# Patient Record
Sex: Female | Born: 2000 | Hispanic: Yes
Health system: Southern US, Academic
[De-identification: ages and names within clinical notes are randomized; demographics above are authoritative.]

## PROBLEM LIST (undated history)

## (undated) ENCOUNTER — Encounter

## (undated) ENCOUNTER — Ambulatory Visit
Payer: PRIVATE HEALTH INSURANCE | Attending: Student in an Organized Health Care Education/Training Program | Primary: Student in an Organized Health Care Education/Training Program

## (undated) ENCOUNTER — Ambulatory Visit

## (undated) ENCOUNTER — Ambulatory Visit: Attending: Pharmacist | Primary: Pharmacist

## (undated) ENCOUNTER — Telehealth
Attending: Student in an Organized Health Care Education/Training Program | Primary: Student in an Organized Health Care Education/Training Program

## (undated) ENCOUNTER — Encounter
Attending: Student in an Organized Health Care Education/Training Program | Primary: Student in an Organized Health Care Education/Training Program

## (undated) ENCOUNTER — Telehealth

## (undated) ENCOUNTER — Encounter: Attending: Family | Primary: Family

## (undated) ENCOUNTER — Encounter: Attending: Pharmacist | Primary: Pharmacist

## (undated) ENCOUNTER — Encounter: Attending: Internal Medicine | Primary: Internal Medicine

## (undated) ENCOUNTER — Ambulatory Visit
Attending: Student in an Organized Health Care Education/Training Program | Primary: Student in an Organized Health Care Education/Training Program

## (undated) ENCOUNTER — Inpatient Hospital Stay (HOSPITAL_COMMUNITY): Payer: Self-pay

## (undated) DIAGNOSIS — D649 Anemia, unspecified: Secondary | ICD-10-CM

## (undated) HISTORY — PX: LAPAROSCOPIC CHOLECYSTECTOMY: SUR755

## (undated) HISTORY — PX: TYMPANOSTOMY TUBE PLACEMENT: SHX32

## (undated) HISTORY — PX: ADENOIDECTOMY: SUR15

---
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
## 2020-12-31 ENCOUNTER — Other Ambulatory Visit: Payer: Self-pay

## 2020-12-31 ENCOUNTER — Emergency Department (HOSPITAL_COMMUNITY)
Admission: EM | Admit: 2020-12-31 | Discharge: 2020-12-31 | Disposition: A | Discharge status: Home / Self Care | Payer: Medicaid Other | Attending: Emergency Medicine | Admitting: Emergency Medicine

## 2020-12-31 DIAGNOSIS — M25521 Pain in right elbow: Secondary | ICD-10-CM | POA: Insufficient documentation

## 2020-12-31 DIAGNOSIS — Z20822 Contact with and (suspected) exposure to covid-19: Secondary | ICD-10-CM | POA: Diagnosis not present

## 2020-12-31 DIAGNOSIS — Z3A30 30 weeks gestation of pregnancy: Secondary | ICD-10-CM | POA: Diagnosis not present

## 2020-12-31 DIAGNOSIS — M25511 Pain in right shoulder: Secondary | ICD-10-CM | POA: Diagnosis not present

## 2020-12-31 DIAGNOSIS — M25572 Pain in left ankle and joints of left foot: Secondary | ICD-10-CM | POA: Diagnosis not present

## 2020-12-31 DIAGNOSIS — M25561 Pain in right knee: Secondary | ICD-10-CM | POA: Insufficient documentation

## 2020-12-31 DIAGNOSIS — M25571 Pain in right ankle and joints of right foot: Secondary | ICD-10-CM | POA: Diagnosis not present

## 2020-12-31 DIAGNOSIS — M25562 Pain in left knee: Secondary | ICD-10-CM | POA: Diagnosis not present

## 2020-12-31 DIAGNOSIS — M25512 Pain in left shoulder: Secondary | ICD-10-CM | POA: Diagnosis not present

## 2020-12-31 DIAGNOSIS — O26893 Other specified pregnancy related conditions, third trimester: Secondary | ICD-10-CM | POA: Insufficient documentation

## 2020-12-31 DIAGNOSIS — M2550 Pain in unspecified joint: Secondary | ICD-10-CM

## 2020-12-31 DIAGNOSIS — M79645 Pain in left finger(s): Secondary | ICD-10-CM | POA: Diagnosis not present

## 2020-12-31 DIAGNOSIS — M79644 Pain in right finger(s): Secondary | ICD-10-CM | POA: Insufficient documentation

## 2020-12-31 LAB — COMPREHENSIVE METABOLIC PANEL
ALT: 21 U/L (ref 0–44)
AST: 26 U/L (ref 15–41)
Albumin: 2.7 g/dL — ABNORMAL LOW (ref 3.5–5.0)
Alkaline Phosphatase: 140 U/L — ABNORMAL HIGH (ref 38–126)
Anion gap: 7 (ref 5–15)
BUN: 6 mg/dL (ref 6–20)
CO2: 23 mmol/L (ref 22–32)
Calcium: 8.9 mg/dL (ref 8.9–10.3)
Chloride: 104 mmol/L (ref 98–111)
Creatinine, Ser: 0.48 mg/dL (ref 0.44–1.00)
GFR, Estimated: >60 mL/min (ref >60)
Glucose, Bld: 89 mg/dL (ref 70–99)
Potassium: 3.9 mmol/L (ref 3.5–5.1)
Sodium: 134 mmol/L — ABNORMAL LOW (ref 135–145)
Total Bilirubin: 0.3 mg/dL (ref 0.3–1.2)
Total Protein: 6.6 g/dL (ref 6.5–8.1)

## 2020-12-31 LAB — URINALYSIS, ROUTINE W REFLEX MICROSCOPIC
Bilirubin Urine: NEGATIVE
Glucose, UA: NEGATIVE mg/dL
Hgb urine dipstick: NEGATIVE
Ketones, ur: NEGATIVE mg/dL
Nitrite: NEGATIVE
Protein, ur: NEGATIVE mg/dL
Specific Gravity, Urine: 1.012 (ref 1.005–1.030)
pH: 8 (ref 5.0–8.0)

## 2020-12-31 LAB — RESP PANEL BY RT-PCR (FLU A&B, COVID) ARPGX2
Influenza A by PCR: NEGATIVE
Influenza B by PCR: NEGATIVE
SARS Coronavirus 2 by RT PCR: NEGATIVE

## 2020-12-31 LAB — CBC WITH DIFFERENTIAL/PLATELET
Abs Immature Granulocytes: 0.02 10*3/uL (ref 0.00–0.07)
Basophils Absolute: 0 10*3/uL (ref 0.0–0.1)
Basophils Relative: 1 %
Eosinophils Absolute: 0.1 10*3/uL (ref 0.0–0.5)
Eosinophils Relative: 1 %
HCT: 32.6 % — ABNORMAL LOW (ref 36.0–46.0)
Hemoglobin: 10.6 g/dL — ABNORMAL LOW (ref 12.0–15.0)
Immature Granulocytes: 0 %
Lymphocytes Relative: 21 %
Lymphs Abs: 1.5 10*3/uL (ref 0.7–4.0)
MCH: 26.8 pg (ref 26.0–34.0)
MCHC: 32.5 g/dL (ref 30.0–36.0)
MCV: 82.5 fL (ref 80.0–100.0)
Monocytes Absolute: 0.3 10*3/uL (ref 0.1–1.0)
Monocytes Relative: 4 %
Neutro Abs: 5.4 10*3/uL (ref 1.7–7.7)
Neutrophils Relative %: 73 %
Platelets: 369 10*3/uL (ref 150–400)
RBC: 3.95 MIL/uL (ref 3.87–5.11)
RDW: 12.8 % (ref 11.5–15.5)
WBC: 7.3 10*3/uL (ref 4.0–10.5)
nRBC: 0 % (ref 0.0–0.2)

## 2020-12-31 LAB — C-REACTIVE PROTEIN: CRP: 2.3 mg/dL — ABNORMAL HIGH (ref <1.0)

## 2020-12-31 LAB — TSH: TSH: 1.864 u[IU]/mL (ref 0.350–4.500)

## 2020-12-31 LAB — SEDIMENTATION RATE: Sed Rate: 63 mm/hr — ABNORMAL HIGH (ref 0–22)

## 2020-12-31 MED ORDER — ACETAMINOPHEN 325 MG PO TABS: 650.0000 mg | ORAL_TABLET | Freq: Four times a day (QID) | ORAL | 0 refills | Status: AC | PRN

## 2020-12-31 NOTE — ED Triage Notes (Signed)
Patient complains of pain in "every joint that moves" that started a few days ago.

## 2020-12-31 NOTE — ED Provider Notes (Signed)
Emergency Medicine Provider Triage Evaluation Note  Theresa Todd , a 20 y.o. female  was evaluated in triage.  Pt complains of polyarthralgia.  Patient states symptoms have been mild for the past 1 to 2 days, however severe this morning.  She is unable to completely extend her left elbow, move her right wrist, or flex at the right hip.  She has never had similar symptoms before.  She took Tylenol this morning without improvement of symptoms.  No fevers, chills, nasal congestion, sore throat, cough.  She is [redacted] weeks pregnant, not considered high risk.  She takes prenatal vitamins, no other medications.  Review of Systems  Positive: Polyarthralgia  Negative: fever  Physical Exam  BP 120/86 (BP Location: Left Arm)   Pulse 93   Temp 98.9 F (37.2 C) (Oral)   Resp 16   SpO2 99%  Gen:   Awake, no distress   Resp:  Normal effort  MSK:   Limited ROM of multiple joints due to pain.  Tenderness palpation of the joints.  No erythema or warmth. Other:  No ttp of back or midline spine. No ttp of the abd  Medical Decision Making  Medically screening exam initiated at 11:39 AM.  Appropriate orders placed.  Theresa Todd was informed that the remainder of the evaluation will be completed by another provider, this initial triage assessment does not replace that evaluation, and the importance of remaining in the ED until their evaluation is complete.  Called [facility]U and discussed with Melanie, APP at the MAU.  Did not feel this was related to patient's pregnancy, and thus did not feel patient was appropriate for transfer to MAU.  As such, all labs and COVID test ordered.  Urine ordered   Alveria Apley, PA-C 12/31/20 1141    Cheryll Cockayne, MD 12/31/20 1443

## 2020-12-31 NOTE — ED Notes (Signed)
Pt sitting up in hallway stretcher, no distress noted. Calm and friendly. Describes recent joint pain and swelling. Notable swelling to knuckles of fingers on both hands. Pt reports ankle swelling and pain as well. Denies history of arthritis, autoimmune conditions etc. Denies recent injury to swollen sites. RN obtained covid swab. Labs and urine obtained in triage. Resting in stretcher, denies needs at this time.

## 2020-12-31 NOTE — ED Provider Notes (Signed)
   3:14 PM Patient signed out to me by previous ED physician.  Patient is a 20 yo female, currently [redacted] weeks pregnant, presenting to ED for polyarthralgia's. Denies fevers, chills, URI symptoms. Signed out to me pending laboratory studies. Currently patient is Aox3, nontoxic appearing, no acute distress, with stable vitals.   Polyarthralgia: -All serious life threatening etiologies considered including but not limited to disseminated gonococcal infection, rubella in pregnancy, viral arthritis, etc.  -No hx of neck pain/stiffness, fevers, rashes, or uri symptoms. No fever, stable vitals, no leukocytosis.  -ANA sent, results in 24-48 hrs -nonspecific inflammatory markers CRP and Sed rate elevated.    Patient in no distress and overall condition improved here in the ED. Detailed discussions were had with the patient regarding current findings, and need for close f/u with Rheumatologist first thing Monday morning. Multiple local Rheumatologists provided. Tylenol sent to pharmacy for symptomatic relief. The patient has been instructed to return immediately if the symptoms worsen in any way for re-evaluation. Patient verbalized understanding and is in agreement with current care plan. All questions answered prior to discharge.     Edwin Dada P, DO 12/31/20 1732

## 2020-12-31 NOTE — ED Provider Notes (Signed)
[facility] EMERGENCY DEPARTMENT Provider Note   CSN: [id number] Arrival date & time: 12/31/20  1126     History Chief Complaint  Patient presents with   Joint Pain    Theresa Todd is a 20 y.o. female.  HPI Patient is [redacted] weeks gestation with a uncomplicated pregnancy.  Patient reports that she has been well but 3 weeks ago noticed pain in her feet.  The toe joints particularly the large toes felt stiff.  It seemed to go away on its own and she just assumed it was due to a lot of walking at work.  2 weeks ago however the pain came back and then started involving multiple joints and in a sending fashion.  She reports now she has swelling on the left side of her left ankle.  The toe joints are quite painful when she walks.  She reports both knees are painful.  Also she has now gotten swelling in the sides of several fingers, more pronounced on the left hand and also the left elbow is so painful she cannot straighten it all the way out.  She reports she can still straighten the right elbow.  She reports both shoulders are also achy and elevating them much is painful.  He denies she has been sick or had anything like this before.  She denies she has had any fevers or chills.  She has not had any headache.  No rashes, no cough, no sore throat.  He has been trying Tylenol and IcyHot with very little relief.  He denies any history of similar episodes.  He reports she has some mosquito bites on her ankles but has not had tick bites and has not had headaches, fevers, chills.    No past medical history on file.  Patient Active Problem List   Diagnosis Date Noted   Migraine without aura and without status migrainosus, not intractable 08/07/2015   Neuropathic pain 06/24/2015   COUGH 03/07/2007   ANEMIA, OTHER, UNSPECIFIED 07/12/2006   HEARING LOSS NOS OR DEAFNESS 07/12/2006    Past Surgical History:  Procedure Laterality Date   ADENOIDECTOMY     COCHLEAR IMPLANT Right 11-02-14    TYMPANOSTOMY TUBE PLACEMENT       OB History   No obstetric history on file.     No family history on file.  Social History   Tobacco Use   Smoking status: Never   Smokeless tobacco: Never  Substance Use Topics   Alcohol use: No   Drug use: No    Home Medications Prior to Admission medications   Medication Sig Start Date End Date Taking? Authorizing Provider  gabapentin (NEURONTIN) 300 MG capsule 1 tab nightly and as needed for headache 06/24/15   Margurite Auerbach, MD  promethazine (PHENERGAN) 12.5 MG tablet 1 tablet every 6 hours as needed for headache. 06/24/15   Margurite Auerbach, MD    Allergies    Patient has no known allergies.  Review of Systems   Review of Systems 10 systems reviewed and negative except per HPI Physical Exam Updated Vital Signs BP 113/79   Pulse 95   Temp 98.9 F (37.2 C) (Oral)   Resp 16   SpO2 98%   Physical Exam Constitutional:      Appearance: Normal appearance.  HENT:     Mouth/Throat:     Mouth: Mucous membranes are moist.     Pharynx: Oropharynx is clear.  Eyes:     Extraocular Movements: Extraocular  movements intact.     Pupils: Pupils are equal, round, and reactive to light.  Cardiovascular:     Rate and Rhythm: Normal rate and regular rhythm.  Pulmonary:     Effort: Pulmonary effort is normal.     Breath sounds: Normal breath sounds.  Abdominal:     Palpations: Abdomen is soft.     Comments: Abdomen is gravid but nontender.  Musculoskeletal:     Cervical back: Neck supple.     Comments: Patient has a number of painful joints.  She does not have erythema of the joints.  They are not warm.  There is some notable swelling on the side of the left ankle and several fingers on the left hand.  Engine motion is limited around the shoulders and the elbow on the left due to pain.  The joints however are not hot, erythematous.  See attached images.  No peripheral edema.  Skin:    General: Skin is warm and dry.  Neurological:      General: No focal deficit present.     Mental Status: She is alert and oriented to person, place, and time.     Coordination: Coordination normal.  Psychiatric:        Mood and Affect: Mood normal.           ED Results / Procedures / Treatments   Labs (all labs ordered are listed, but only abnormal results are displayed) Labs Reviewed  CBC WITH DIFFERENTIAL/PLATELET - Abnormal; Notable for the following components:      Result Value   Hemoglobin 10.6 (*)    HCT 32.6 (*)    All other components within normal limits  COMPREHENSIVE METABOLIC PANEL - Abnormal; Notable for the following components:   Sodium 134 (*)    Albumin 2.7 (*)    Alkaline Phosphatase 140 (*)    All other components within normal limits  URINALYSIS, ROUTINE W REFLEX MICROSCOPIC - Abnormal; Notable for the following components:   Leukocytes,Ua SMALL (*)    Bacteria, UA RARE (*)    All other components within normal limits  C-REACTIVE PROTEIN - Abnormal; Notable for the following components:   CRP 2.3 (*)    All other components within normal limits  RESP PANEL BY RT-PCR (FLU A&B, COVID) ARPGX2  ANA W/REFLEX IF POSITIVE  SEDIMENTATION RATE  RHEUMATOID FACTOR  TSH    EKG None  Radiology No results found.  Procedures Procedures   Medications Ordered in ED Medications - No data to display  ED Course  I have reviewed the triage vital signs and the nursing notes.  Pertinent labs & imaging results that were available during my care of the patient were reviewed by me and considered in my medical decision making (see chart for details).    MDM Rules/Calculators/A&P                           Patient has developed a polyarthralgia that seem to start from the feet and has ascended.  Patient has not had any fever, no rash, no headache, no URI symptoms.  She is otherwise clinically well.  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  CRP elevated.  Awaiting sed rate.  ANA  and rheumatoid factor ordered.  Oncoming Dr. 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 Final Clinical Impression(s) / ED Diagnoses Final diagnoses:  Polyarthralgia  [redacted] weeks gestation of pregnancy    Rx / DC Orders ED Discharge Orders     None        Arby Barrette, MD 12/31/20 1545

## 2020-12-31 NOTE — ED Notes (Signed)
Pt verbalized understanding of d/c instructions, meds and followup care. Denies questions. VSS, no distress noted. Steady gait to exit with all belongings accompanied by family

## 2021-01-01 LAB — RHEUMATOID FACTOR: Rheumatoid fact SerPl-aCnc: 127.6 IU/mL — ABNORMAL HIGH (ref <14.0)

## 2021-01-03 LAB — ANA W/REFLEX IF POSITIVE: Anti Nuclear Antibody (ANA): NEGATIVE

## 2021-05-31 ENCOUNTER — Emergency Department (HOSPITAL_COMMUNITY): Payer: Medicaid Other

## 2021-05-31 ENCOUNTER — Encounter (HOSPITAL_COMMUNITY): Payer: Self-pay | Admitting: Emergency Medicine

## 2021-05-31 ENCOUNTER — Emergency Department (HOSPITAL_COMMUNITY)
Admission: EM | Admit: 2021-05-31 | Discharge: 2021-05-31 | Disposition: A | Discharge status: Home / Self Care | Payer: Medicaid Other | Attending: Emergency Medicine | Admitting: Emergency Medicine

## 2021-05-31 DIAGNOSIS — R109 Unspecified abdominal pain: Secondary | ICD-10-CM | POA: Insufficient documentation

## 2021-05-31 DIAGNOSIS — K625 Hemorrhage of anus and rectum: Secondary | ICD-10-CM | POA: Diagnosis present

## 2021-05-31 HISTORY — DX: Anemia, unspecified: D64.9

## 2021-05-31 LAB — TYPE AND SCREEN
ABO/RH(D): O POS
Antibody Screen: NEGATIVE

## 2021-05-31 LAB — COMPREHENSIVE METABOLIC PANEL
ALT: 36 U/L (ref 0–44)
AST: 26 U/L (ref 15–41)
Albumin: 3.3 g/dL — ABNORMAL LOW (ref 3.5–5.0)
Alkaline Phosphatase: 92 U/L (ref 38–126)
Anion gap: 8 (ref 5–15)
BUN: 14 mg/dL (ref 6–20)
CO2: 24 mmol/L (ref 22–32)
Calcium: 8.9 mg/dL (ref 8.9–10.3)
Chloride: 106 mmol/L (ref 98–111)
Creatinine, Ser: 0.63 mg/dL (ref 0.44–1.00)
GFR, Estimated: >60 mL/min (ref >60)
Glucose, Bld: 106 mg/dL — ABNORMAL HIGH (ref 70–99)
Potassium: 4.1 mmol/L (ref 3.5–5.1)
Sodium: 138 mmol/L (ref 135–145)
Total Bilirubin: 0.2 mg/dL — ABNORMAL LOW (ref 0.3–1.2)
Total Protein: 7.1 g/dL (ref 6.5–8.1)

## 2021-05-31 LAB — I-STAT BETA HCG BLOOD, ED (MC, WL, AP ONLY): I-stat hCG, quantitative: <5 m[IU]/mL (ref <5)

## 2021-05-31 LAB — CBC
HCT: 34.8 % — ABNORMAL LOW (ref 36.0–46.0)
Hemoglobin: 10.9 g/dL — ABNORMAL LOW (ref 12.0–15.0)
MCH: 24.1 pg — ABNORMAL LOW (ref 26.0–34.0)
MCHC: 31.3 g/dL (ref 30.0–36.0)
MCV: 77 fL — ABNORMAL LOW (ref 80.0–100.0)
Platelets: 509 10*3/uL — ABNORMAL HIGH (ref 150–400)
RBC: 4.52 MIL/uL (ref 3.87–5.11)
RDW: 15.9 % — ABNORMAL HIGH (ref 11.5–15.5)
WBC: 8.8 10*3/uL (ref 4.0–10.5)
nRBC: 0 % (ref 0.0–0.2)

## 2021-05-31 MED ORDER — IOHEXOL 350 MG/ML SOLN
80.0000 mL | Freq: Once | INTRAVENOUS | Status: AC | PRN
  Administered 2021-05-31: 80 mL via INTRAVENOUS

## 2021-05-31 NOTE — Discharge Instructions (Addendum)
It was our pleasure to provide your ER care today - we hope that you feel better.  Drink plenty of fluids/stay well hydrated.   Follow up with primary care doctor and gi doctor in the next 1-2 weeks - call office to arrange appointment.   Return to ER if worse, new symptoms, weak/fainting, trouble breathing, persistent/heavy bleeding, or other concern.

## 2021-05-31 NOTE — ED Notes (Signed)
Patient transported to CT 

## 2021-05-31 NOTE — ED Provider Triage Note (Signed)
Emergency Medicine Provider Triage Evaluation Note  Theresa Todd , a 21 y.o. female  was evaluated in triage.  Pt complains of rectal bleeding x1 week.  Patient states that she has been seeing bright red blood and blood clots in the toilet and on toilet paper after having bowel movements.  She continues to see blood every time she uses the bathroom.  She is also complaining of constipation.  Last bowel movement was yesterday evening, less than normal.  Spanish interpreter used in triage.  Review of Systems  Positive: Rectal bleeding, constipation Negative: Abdominal pain, rectal pain, fever, chills, nausea or vomiting  Physical Exam  BP 133/80 (BP Location: Right Arm)    Pulse 96    Temp 98.7 F (37.1 C) (Oral)    Resp 14    SpO2 100%  Gen:   Awake, no distress   Resp:  Normal effort  MSK:   Moves extremities without difficulty  Other:    Medical Decision Making  Medically screening exam initiated at 12:26 PM.  Appropriate orders placed.  Sheena P Grabinski was informed that the remainder of the evaluation will be completed by another provider, this initial triage assessment does not replace that evaluation, and the importance of remaining in the ED until their evaluation is complete.     Su Monks, PA-C 05/31/21 1232

## 2021-05-31 NOTE — ED Triage Notes (Signed)
Patient here for evaluation of rectal bleeding that started approximately one week ago. Patient states she has been seeing bright red blood and blood clots in the toilet and on toilet paper after having a bowel movement. Patient also reports constipation and is alert, oriented, and in no apparent distress at this time.

## 2021-05-31 NOTE — ED Provider Notes (Signed)
[facility] EMERGENCY DEPARTMENT Provider Note   CSN: [id number] Arrival date & time: 05/31/21  1049     History  Chief Complaint  Patient presents with   GI Problem    Theresa Todd is a 21 y.o. female.  Patient complains of having rectal bleeding over the last couple weeks.  She is not having any significant pain or vomiting or diarrhea.  History of cholecystectomy  The history is provided by the patient and medical records. No language interpreter was used.  GI Problem This is a new problem. The current episode started more than 1 week ago. The problem occurs constantly. The problem has not changed since onset.Pertinent negatives include no chest pain, no abdominal pain and no headaches. Nothing aggravates the symptoms. She has tried nothing for the symptoms. The treatment provided no relief.      Home Medications Prior to Admission medications   Medication Sig Start Date End Date Taking? Authorizing Provider  acetaminophen (TYLENOL) 325 MG tablet Take 2 tablets (650 mg total) by mouth every 6 (six) hours as needed. Q000111Q   Lianne Cure, DO  gabapentin (NEURONTIN) 300 MG capsule 1 tab nightly and as needed for headache 06/24/15   Rocky Link, MD  promethazine (PHENERGAN) 12.5 MG tablet 1 tablet every 6 hours as needed for headache. 06/24/15   Rocky Link, MD      Allergies    Patient has no known allergies.    Review of Systems   Review of Systems  Constitutional:  Negative for appetite change and fatigue.  HENT:  Negative for congestion, ear discharge and sinus pressure.   Eyes:  Negative for discharge.  Respiratory:  Negative for cough.   Cardiovascular:  Negative for chest pain.  Gastrointestinal:  Negative for abdominal pain and diarrhea.       Rectal bleeding  Genitourinary:  Negative for frequency and hematuria.  Musculoskeletal:  Negative for back pain.  Skin:  Negative for rash.  Neurological:  Negative for seizures and  headaches.  Psychiatric/Behavioral:  Negative for hallucinations.    Physical Exam Updated Vital Signs BP 117/84 (BP Location: Right Arm)    Pulse 88    Temp 97.8 F (36.6 C) (Oral)    Resp 18    Ht 5\' 1"  (1.549 m)    SpO2 100%  Physical Exam Vitals and nursing note reviewed.  Constitutional:      Appearance: She is well-developed.  HENT:     Head: Normocephalic.     Nose: Nose normal.  Eyes:     General: No scleral icterus.    Conjunctiva/sclera: Conjunctivae normal.  Neck:     Thyroid: No thyromegaly.  Cardiovascular:     Rate and Rhythm: Normal rate and regular rhythm.     Heart sounds: No murmur heard.   No friction rub. No gallop.  Pulmonary:     Breath sounds: No stridor. No wheezing or rales.  Chest:     Chest wall: No tenderness.  Abdominal:     General: There is no distension.     Tenderness: There is abdominal tenderness. There is no rebound.  Genitourinary:    Comments: Rectal exam finds scant blood heme positive Musculoskeletal:        General: Normal range of motion.     Cervical back: Neck supple.  Lymphadenopathy:     Cervical: No cervical adenopathy.  Skin:    Findings: No erythema or rash.  Neurological:     Mental  Status: She is alert and oriented to person, place, and time.     Motor: No abnormal muscle tone.     Coordination: Coordination normal.  Psychiatric:        Behavior: Behavior normal.    ED Results / Procedures / Treatments   Labs (all labs ordered are listed, but only abnormal results are displayed) Labs Reviewed  COMPREHENSIVE METABOLIC PANEL - Abnormal; Notable for the following components:      Result Value   Glucose, Bld 106 (*)    Albumin 3.3 (*)    Total Bilirubin 0.2 (*)    All other components within normal limits  CBC - Abnormal; Notable for the following components:   Hemoglobin 10.9 (*)    HCT 34.8 (*)    MCV 77.0 (*)    MCH 24.1 (*)    RDW 15.9 (*)    Platelets 509 (*)    All other components within normal  limits  I-STAT BETA HCG BLOOD, ED ([facility], [facility], [facility] ONLY)  POC OCCULT BLOOD, ED  TYPE AND SCREEN  ABO/RH    EKG None  Radiology No results found.  Procedures Procedures    Medications Ordered in ED Medications - No data to display  ED Course/ Medical Decision Making/ A&P                           Medical Decision Making Amount and/or Complexity of Data Reviewed Labs: ordered. Radiology: ordered.  Risk Prescription drug management.   Patient with rectal bleeding and no significant anemia.  Patient also has abdominal pain.  Will get CT scan and most likely disposition her to follow-up with GI  This patient presents to the ED for concern of abdominal pain, and rectal bleeding this involves an extensive number of treatment options, and is a complaint that carries with it a high risk of complications and morbidity.  The differential diagnosis includes appendicitis, colon cancer, diverticulitis   Co morbidities that complicate the patient evaluation  Obesity   Additional history obtained:  Additional history obtained from patient External records from outside source obtained and reviewed including hospital record   Lab Tests:  I Ordered, and personally interpreted labs.  The pertinent results include: Hemoglobin low at 10.9   Imaging Studies ordered:  I ordered imaging studies including CT abdomen I independently visualized and interpreted imaging which showed cholecystectomy and at that I agree with the radiologist interpretation   Cardiac Monitoring:  The patient was maintained on a cardiac monitor.  I personally viewed and interpreted the cardiac monitored which showed an underlying rhythm of: Normal sinus rhythm   Medicines ordered and prescription drug management:  I ordered medication including bolus of normal saline Reevaluation of the patient after these medicines showed that the patient stayed the same I have reviewed the patients home medicines and  have made adjustments as needed   Test Considered:  Colonoscopy   Critical Interventions:  None   Consultations Obtained:  No consultants but referred to GI  Problem List / ED Course:  Rectal bleeding   Reevaluation:  After the interventions noted above, I reevaluated the patient and found that they have :improved   Social Determinants of Health:  None   Dispostion:  After consideration of the diagnostic results and the patients response to treatment, I feel that the patent would benefit from discharge home with follow-up with GI.         Final Clinical Impression(s) /  ED Diagnoses Final diagnoses:  None    Rx / DC Orders ED Discharge Orders     None         Milton Ferguson, MD 06/02/21 [phone]

## 2021-05-31 NOTE — ED Provider Notes (Signed)
Signed out by Dr Estell Harpin at 1630  that labs done, and that patient has c/o rectal bleeding and plan is to refer to outpatient gi follow up, and to check on CT abd result when completed.   CT reviewed/interpreted by me - no mass, neg for acute process.   Labs reviewed/interpreted by me - wbc normal. Hct 34.8.   Patient denies abd pain and is asking for food/drink - provided.   No faintness or dizziness, vitals normal.   Pt currently appears stable for d/c w plan for outpatient pcp and gi follow up.  Return precautions provided.    Cathren Laine, MD 05/31/21 1736

## 2021-06-06 ENCOUNTER — Ambulatory Visit
Admit: 2021-06-06 | Discharge: 2021-06-06 | Disposition: A | Discharge status: Home / Self Care | Payer: PRIVATE HEALTH INSURANCE | Attending: Emergency Medicine

## 2021-06-06 DIAGNOSIS — M0579 Rheumatoid arthritis with rheumatoid factor of multiple sites without organ or systems involvement: Principal | ICD-10-CM

## 2021-06-06 MED ORDER — PREDNISONE 20 MG TABLET
ORAL_TABLET | Freq: Every day | ORAL | 0 refills | 7 days | Status: CP
Start: 2021-06-06 — End: 2021-06-13

## 2021-06-10 ENCOUNTER — Ambulatory Visit
Admit: 2021-06-10 | Discharge: 2021-06-11 | Payer: PRIVATE HEALTH INSURANCE | Attending: Student in an Organized Health Care Education/Training Program | Primary: Student in an Organized Health Care Education/Training Program

## 2021-06-10 DIAGNOSIS — M059 Rheumatoid arthritis with rheumatoid factor, unspecified: Principal | ICD-10-CM

## 2021-06-10 MED ORDER — METHOTREXATE SODIUM 2.5 MG TABLET
ORAL_TABLET | ORAL | 0 refills | 104.00000 days | Status: CP
Start: 2021-06-10 — End: 2021-09-22

## 2021-06-10 MED ORDER — FOLIC ACID 1 MG TABLET
ORAL_TABLET | Freq: Every day | ORAL | 3 refills | 90 days | Status: CP
Start: 2021-06-10 — End: 2021-06-10

## 2021-06-10 MED ORDER — PREDNISONE 5 MG TABLET
ORAL_TABLET | ORAL | 0 refills | 81 days | Status: CP
Start: 2021-06-10 — End: 2021-08-30

## 2021-06-16 DIAGNOSIS — Z79631 Methotrexate, long term, current use: Principal | ICD-10-CM

## 2021-06-16 DIAGNOSIS — M059 Rheumatoid arthritis with rheumatoid factor, unspecified: Principal | ICD-10-CM

## 2021-07-06 DIAGNOSIS — M059 Rheumatoid arthritis with rheumatoid factor, unspecified: Principal | ICD-10-CM

## 2021-07-07 DIAGNOSIS — Z79631 Methotrexate, long term, current use: Principal | ICD-10-CM

## 2021-07-07 DIAGNOSIS — M059 Rheumatoid arthritis with rheumatoid factor, unspecified: Principal | ICD-10-CM

## 2021-07-07 MED ORDER — PREDNISONE 5 MG TABLET
ORAL_TABLET | ORAL | 0 refills | 60.00000 days | Status: CP
Start: 2021-07-07 — End: 2021-09-05

## 2021-08-23 DIAGNOSIS — M059 Rheumatoid arthritis with rheumatoid factor, unspecified: Principal | ICD-10-CM

## 2021-08-23 MED ORDER — PREDNISONE 5 MG TABLET
ORAL_TABLET | ORAL | 0 refills | 26 days | Status: CP
Start: 2021-08-23 — End: 2021-09-18

## 2021-08-24 ENCOUNTER — Ambulatory Visit
Admit: 2021-08-24 | Discharge: 2021-08-24 | Disposition: A | Discharge status: Home / Self Care | Payer: PRIVATE HEALTH INSURANCE

## 2021-08-24 ENCOUNTER — Encounter (HOSPITAL_COMMUNITY): Payer: Self-pay

## 2021-08-24 ENCOUNTER — Emergency Department (HOSPITAL_COMMUNITY)
Admission: EM | Admit: 2021-08-24 | Discharge: 2021-08-24 | Disposition: A | Discharge status: Home / Self Care | Payer: Medicaid Other | Attending: Emergency Medicine | Admitting: Emergency Medicine

## 2021-08-24 ENCOUNTER — Other Ambulatory Visit: Payer: Self-pay

## 2021-08-24 DIAGNOSIS — R52 Pain, unspecified: Secondary | ICD-10-CM

## 2021-08-24 DIAGNOSIS — E119 Type 2 diabetes mellitus without complications: Secondary | ICD-10-CM | POA: Insufficient documentation

## 2021-08-24 DIAGNOSIS — Z20822 Contact with and (suspected) exposure to covid-19: Secondary | ICD-10-CM | POA: Diagnosis not present

## 2021-08-24 DIAGNOSIS — M791 Myalgia, unspecified site: Secondary | ICD-10-CM | POA: Insufficient documentation

## 2021-08-24 LAB — RESP PANEL BY RT-PCR (FLU A&B, COVID) ARPGX2
Influenza A by PCR: NEGATIVE
Influenza B by PCR: NEGATIVE
SARS Coronavirus 2 by RT PCR: NEGATIVE

## 2021-08-24 LAB — CBC WITH DIFFERENTIAL/PLATELET
Abs Immature Granulocytes: 0.04 10*3/uL (ref 0.00–0.07)
Basophils Absolute: 0 10*3/uL (ref 0.0–0.1)
Basophils Relative: 0 %
Eosinophils Absolute: 0.1 10*3/uL (ref 0.0–0.5)
Eosinophils Relative: 1 %
HCT: 39.6 % (ref 36.0–46.0)
Hemoglobin: 12.2 g/dL (ref 12.0–15.0)
Immature Granulocytes: 0 %
Lymphocytes Relative: 22 %
Lymphs Abs: 2.1 10*3/uL (ref 0.7–4.0)
MCH: 24.5 pg — ABNORMAL LOW (ref 26.0–34.0)
MCHC: 30.8 g/dL (ref 30.0–36.0)
MCV: 79.7 fL — ABNORMAL LOW (ref 80.0–100.0)
Monocytes Absolute: 0.4 10*3/uL (ref 0.1–1.0)
Monocytes Relative: 4 %
Neutro Abs: 6.8 10*3/uL (ref 1.7–7.7)
Neutrophils Relative %: 73 %
Platelets: 505 10*3/uL — ABNORMAL HIGH (ref 150–400)
RBC: 4.97 MIL/uL (ref 3.87–5.11)
RDW: 16.9 % — ABNORMAL HIGH (ref 11.5–15.5)
WBC: 9.5 10*3/uL (ref 4.0–10.5)
nRBC: 0 % (ref 0.0–0.2)

## 2021-08-24 LAB — URINALYSIS, DIPSTICK ONLY
Bilirubin Urine: NEGATIVE
Glucose, UA: NEGATIVE mg/dL
Hgb urine dipstick: NEGATIVE
Ketones, ur: NEGATIVE mg/dL
Nitrite: NEGATIVE
Protein, ur: NEGATIVE mg/dL
Specific Gravity, Urine: 1.021 (ref 1.005–1.030)
pH: 5 (ref 5.0–8.0)

## 2021-08-24 LAB — BASIC METABOLIC PANEL
Anion gap: 9 (ref 5–15)
BUN: 12 mg/dL (ref 6–20)
CO2: 23 mmol/L (ref 22–32)
Calcium: 9.2 mg/dL (ref 8.9–10.3)
Chloride: 104 mmol/L (ref 98–111)
Creatinine, Ser: 0.61 mg/dL (ref 0.44–1.00)
GFR, Estimated: >60 mL/min (ref >60)
Glucose, Bld: 106 mg/dL — ABNORMAL HIGH (ref 70–99)
Potassium: 3.6 mmol/L (ref 3.5–5.1)
Sodium: 136 mmol/L (ref 135–145)

## 2021-08-24 LAB — I-STAT BETA HCG BLOOD, ED (MC, WL, AP ONLY): I-stat hCG, quantitative: <5 m[IU]/mL (ref <5)

## 2021-08-24 LAB — CK: Total CK: 30 U/L — ABNORMAL LOW (ref 38–234)

## 2021-08-24 MED ORDER — LIDOCAINE 5 % EX PTCH: 1.0000 | MEDICATED_PATCH | CUTANEOUS | 0 refills | Status: DC

## 2021-08-24 MED ORDER — CEPHALEXIN 500 MG PO CAPS: 500.0000 mg | ORAL_CAPSULE | Freq: Two times a day (BID) | ORAL | 0 refills | Status: AC

## 2021-08-24 MED ORDER — DEXAMETHASONE SODIUM PHOSPHATE 10 MG/ML IJ SOLN
10.0000 mg | Freq: Once | INTRAMUSCULAR | Status: AC
  Administered 2021-08-24: 10 mg via INTRAMUSCULAR
  Filled 2021-08-24: qty 1

## 2021-08-24 MED ORDER — LIDOCAINE 5 % EX PTCH
1.0000 | MEDICATED_PATCH | Freq: Once | CUTANEOUS | Status: DC
  Administered 2021-08-24: 1 via TRANSDERMAL
  Filled 2021-08-24: qty 1

## 2021-08-24 NOTE — ED Notes (Signed)
Patient verbalizes understanding of d/c instructions. Opportunities for questions and answers were provided. Pt d/c from ED and wheeled to lobby where family is picking her up. ?

## 2021-08-24 NOTE — ED Provider Notes (Signed)
?[facility] ?Provider Note ? ? ?CSN: [id number] ?Arrival date & time: 08/24/21  1444 ? ?  ? ?History ? ?Chief Complaint  ?Patient presents with  ? bodyaches  ? ? ?Theresa Todd is a 21 y.o. female with a past medical history of polyarthralgia, rheumatoid arthritis who presents to the emergency department complaining of generalized body aches onset 2 weeks.  Patient denies any recent heavy lifting, injury, trauma.  She notes that she works [employer], [employer].  Patient notes she has a history of similar symptoms occurring on the right and at that time she was told she had a kidney stone.  Denies past medical history of diabetes. Denies fever, chills, abdominal pain, nausea, vomiting, chest pain, shortness of breath, dysuria, hematuria. ? ? ?Per patient chart review: Patient was evaluated at [facility] on 08/15/2021 for similar symptoms.  Patient had a lumbar x-ray completed at that time notable for no acute fracture or traumatic malalignment, disc spaces are maintained, SI joints maintained, no erosions.  Contraceptive device projects over the pelvis, right upper quadrant surgical clips.  At that time she was given a prescription for prednisone and advised to maintain her follow-up appointment with her rheumatologist. ? ?The history is provided by the patient. No language interpreter was used.  ? ?  ? ?Home Medications ?Prior to Admission medications   ?Medication Sig Start Date End Date Taking? Authorizing Provider  ?acetaminophen (TYLENOL) 325 MG tablet Take 2 tablets (650 mg total) by mouth every 6 (six) hours as needed. Q000111Q   Lianne Cure, DO  ?gabapentin (NEURONTIN) 300 MG capsule 1 tab nightly and as needed for headache 06/24/15   Rocky Link, MD  ?promethazine (PHENERGAN) 12.5 MG tablet 1 tablet every 6 hours as needed for headache. 06/24/15   Rocky Link, MD  ?   ? ?Allergies    ?Patient has no known allergies.   ? ?Review of Systems   ?Review of Systems   ?Constitutional:  Negative for chills and fever.  ?Respiratory:  Negative for shortness of breath.   ?Cardiovascular:  Negative for chest pain.  ?Gastrointestinal:  Negative for abdominal pain, nausea and vomiting.  ?Genitourinary:  Negative for dysuria and hematuria.  ?Musculoskeletal:  Positive for back pain and myalgias.  ?All other systems reviewed and are negative. ? ?Physical Exam ?Updated Vital Signs ?BP 117/77   Pulse (!) 120   Temp 98.5 ?F (36.9 ?C) (Oral)   Resp 16   Ht 5\' 1"  (1.549 m)   SpO2 97%  ?Physical Exam ?Vitals and nursing note reviewed.  ?Constitutional:   ?   General: She is not in acute distress. ?   Appearance: She is not diaphoretic.  ?HENT:  ?   Head: Normocephalic and atraumatic.  ?   Mouth/Throat:  ?   Pharynx: No oropharyngeal exudate.  ?Eyes:  ?   General: No scleral icterus. ?   Conjunctiva/sclera: Conjunctivae normal.  ?Cardiovascular:  ?   Rate and Rhythm: Normal rate and regular rhythm.  ?   Pulses: Normal pulses.  ?   Heart sounds: Normal heart sounds.  ?Pulmonary:  ?   Effort: Pulmonary effort is normal. No respiratory distress.  ?   Breath sounds: Normal breath sounds. No wheezing.  ?Abdominal:  ?   General: Bowel sounds are normal.  ?   Palpations: Abdomen is soft. There is no mass.  ?   Tenderness: There is no abdominal tenderness. There is no guarding or rebound.  ?Musculoskeletal:     ?  General: Normal range of motion.  ?   Cervical back: Normal range of motion and neck supple.  ?   Comments: No C, T, L, S spinal tenderness to palpation.  Tenderness to palpation noted to left thoracic region.  No overlying skin changes.  ?Skin: ?   General: Skin is warm and dry.  ?Neurological:  ?   Mental Status: She is alert.  ?Psychiatric:     ?   Behavior: Behavior normal.  ? ? ?ED Results / Procedures / Treatments   ?Labs ?(all labs ordered are listed, but only abnormal results are displayed) ?Labs Reviewed  ?CBC WITH DIFFERENTIAL/PLATELET - Abnormal; Notable for the following  components:  ?    Result Value  ? MCV 79.7 (*)   ? MCH 24.5 (*)   ? RDW 16.9 (*)   ? Platelets 505 (*)   ? All other components within normal limits  ?BASIC METABOLIC PANEL - Abnormal; Notable for the following components:  ? Glucose, Bld 106 (*)   ? All other components within normal limits  ?CK - Abnormal; Notable for the following components:  ? Total CK 30 (*)   ? All other components within normal limits  ?URINALYSIS, DIPSTICK ONLY - Abnormal; Notable for the following components:  ? APPearance HAZY (*)   ? Leukocytes,Ua MODERATE (*)   ? All other components within normal limits  ?RESP PANEL BY RT-PCR (FLU A&B, COVID) ARPGX2  ?URINE CULTURE  ?I-STAT BETA HCG BLOOD, ED ([facility], [facility], [facility] ONLY)  ? ? ?EKG ?None ? ?Radiology ?No results found. ? ?Procedures ?Procedures  ? ? ?Medications Ordered in ED ?Medications  ?lidocaine (LIDODERM) 5 % 1 patch (1 patch Transdermal Patch Applied 08/24/21 1928)  ?dexamethasone (DECADRON) injection 10 mg (10 mg Intramuscular Given 08/24/21 1930)  ? ? ?ED Course/ Medical Decision Making/ A&P ?Clinical Course as of 08/24/21 2000  ?Wed Aug 24, 2021  ?1913 Patient reevaluated and noted improvement of her symptoms with treatment regimen in the ED.  Discussed with patient and family regarding urine results.  Discussed with patient and family regarding importance of maintaining her scheduled appoint with her rheumatologist on 09/07/2021.  Also discussed importance of calling her rheumatologist tomorrow regarding her ED visit.  Discussed discharge treatment plan.  Patient agreeable at this time.  Patient appears safe for discharge. [SB]  ?  ?Clinical Course User Index ?[SB] Makai Agostinelli A, PA-C  ? ?                        ?Medical Decision Making ?Amount and/or Complexity of Data Reviewed ?Labs: ordered. ? ?Risk ?Prescription drug management. ? ? ?Pt presents with generalized body aches onset 2 weeks.  Denies recent heavy lifting, trauma, fall.  Denies fever, chills, urinary symptoms, chest pain,  shortness of breath.  Patient notes primarily left lower back pain.  Has a history of similar symptoms.  Patient with a history of rheumatoid arthritis.  Vital signs, patient afebrile, slightly tachycardic at 120.  On exam patient with no spinal tenderness to palpation.  Tenderness to palpation noted to left flank.  No acute cardiovascular, respiratory, [place] findings. Differential diagnosis includes rheumatoid arthritis flare, COVID, flu, acute cystitis, pyelonephritis, nephrolithiasis.  ? ?Additional history obtained:  ?External records from outside source obtained and reviewed including: Patient with multiple prior visits to the emergency department for polyarthritis and rheumatoid arthritis.  Patient's rheumatologist is Meda Coffee through [facility].  Patient completed her Rx of prednisone 1  month ago. Patient was evaluated at [facility] ED on 08/15/2021 for similar symptoms.  Patient had a lumbar x-ray completed at that time notable for no acute fracture or traumatic malalignment, disc spaces are maintained, SI joints maintained, no erosions.  Contraceptive device projects over the pelvis, right upper quadrant surgical clips.  At that time she was given a prescription for prednisone and advised to maintain her follow-up appointment with her rheumatologist. ? ?Labs:  ?I ordered, and personally interpreted labs.  The pertinent results include:   ?CK at 30. ?I-STAT beta-hCG less than 5 and unremarkable. ?CBC without anemia or leukocytosis and unremarkable. ?BMP overall unremarkable. ?COVID and flu swab negative. ?Urinalysis with moderate leukocytes otherwise unremarkable. ? ?Medications:  ?I ordered medication including Decadron and lidocaine patch for pain management ?Reevaluation of the patient after these medicines and interventions, I reevaluated the patient and found that they have improved ?I have reviewed the patients home medicines and have made adjustments as needed ? ? ?Disposition: ?Pt  presentation suspicious for acute cystitis at this time. Will send urine for culture. Doubt need for additional imaging at this time, patient without any recent heavy lifting, trauma, fall or injury.  Doubt flare of rheumatoid

## 2021-08-24 NOTE — ED Provider Triage Note (Signed)
Emergency Medicine Provider Triage Evaluation Note ? ?Theresa Todd , a 21 y.o. female  was evaluated in triage.  Pt complains of myalgia. ? ?Review of Systems  ?Positive: Body aches, feels swollen ?Negative: Fever, cold sxs, dysuria ? ?Physical Exam  ?BP 117/77   Pulse (!) 120   Temp 98.5 ?F (36.9 ?C) (Oral)   Resp 16   Ht 5\' 1"  (1.549 m)   SpO2 97%  ?Gen:   Awake, no distress   ?Resp:  Normal effort  ?MSK:   Moves extremities without difficulty  ?Other:   ? ?Medical Decision Making  ?Medically screening exam initiated at 4:16 PM.  Appropriate orders placed.  Theresa Todd was informed that the remainder of the evaluation will be completed by another provider, this initial triage assessment does not replace that evaluation, and the importance of remaining in the ED until their evaluation is complete. ? ?Report back pain and body aches x 2 weeks.  No cold sxs, no urinary sxs ?  ? , PA-C ?08/24/21 1619 ? ?

## 2021-08-24 NOTE — ED Triage Notes (Signed)
Pt c/o bodyachesx2wks. Pt states it started on left side of back and spread to rest of body. Pt denies N/V/D. Pt states her body feels swollen and intermittent numbness in body. ?

## 2021-08-24 NOTE — Discharge Instructions (Addendum)
.  It was a pleasure taking care of you today! ? ?Your labs are overall unremarkable.  Your urine will be sent off for culture.  In the meantime you will be treated with an antibiotic called Keflex, ensure to take the entire antibiotic prescription.  Ensure to maintain fluid intake.  You also be sent lidocaine patches.  If it is too expensive, you may use the over-the-counter lidocaine patches.  Place 1 patch onto the skin, remove and discard the patch after 12 hours and place a new patch to the skin.  Continue taking your prescription medications as prescribed by your rheumatologist. You may take over-the-counter 500 mg Tylenol every 6 hours or 600 mg Tylenol every 6 hours as needed for pain for no more than 7 days.  Call your rheumatologist tomorrow and inform them of the ED visit today.  Maintain your scheduled follow-up appointment with your rheumatologist on 09/07/2021.  You may follow with your primary care provider as needed.  Return to the emergency department experiencing increasing/worsening abdominal pain, vomiting, fever, worsening back pain, worsening symptoms. ?

## 2021-09-07 ENCOUNTER — Ambulatory Visit
Admit: 2021-09-07 | Discharge: 2021-09-08 | Payer: PRIVATE HEALTH INSURANCE | Attending: Student in an Organized Health Care Education/Training Program | Primary: Student in an Organized Health Care Education/Training Program

## 2021-09-07 DIAGNOSIS — M0579 Rheumatoid arthritis with rheumatoid factor of multiple sites without organ or systems involvement: Principal | ICD-10-CM

## 2021-09-07 DIAGNOSIS — M059 Rheumatoid arthritis with rheumatoid factor, unspecified: Principal | ICD-10-CM

## 2021-09-07 MED ORDER — PREDNISONE 5 MG TABLET
ORAL_TABLET | Freq: Every day | ORAL | 1 refills | 30 days | Status: CP
Start: 2021-09-07 — End: ?

## 2021-09-07 MED ORDER — ENBREL SURECLICK 50 MG/ML (1 ML) SUBCUTANEOUS PEN INJECTOR
SUBCUTANEOUS | 3 refills | 28 days | Status: CP
Start: 2021-09-07 — End: ?
  Filled 2021-10-17: qty 4, 28d supply, fill #0

## 2021-09-07 NOTE — Unmapped (Signed)
Rheumatology Clinic Follow-up Note     Assessment/Plan:    Alicia Norris is a 21 y.o.  female with a PMH of seropositive RA who presents today for follow up of RA.    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 No disease activity today but on 15mg  of pred. We discussed options including maximizing MTX at 25mg  SQ vs. Addition of another agent (biologic vs. HCQ). We agreed with plan to start Enbrel. She will need a repeat quant gold test as her last was indeterminate (lack of mitogen response while on pred, no risk factors so I believe lower likelihood she has latent TB).  - Prednisone:  ---- Doreatha Martin 15mg  x7d  ---- Then, 10mg  x7-14d, try to reduce to 5mg  daily until seen  - Continue MTX 20mg  qweekly + FA  - Repeat quantgold, plan is to start Enbrel  - Reviewed recent CBC w/ diff (microcytosis but no anemia, thrombocytosis), BMP (nl Cr)  - Hepatic panel     Return in about 15 weeks (around 12/21/2021).    Patient was discussed with attending physician, Dr. Harrison Mons, MD   Rheumatology Fellow, PGY5    Primary Care Provider: [facility]    HPI:  Alicia Norris is a 21 y.o.  female with a PMH of seropositive RA who presents today for follow up of RA.    Today  The patient returns today after having been on methotrexate 20 mg for the past few months.  Reports that she is tolerating it well without any side effects.  She does note that she does not feel like it is working quite well, wall she feels that has helped her stiffness and pain day-to-day she continues to have persistent disease activity is characterized by pain and swelling.  She also notes that she has had a few flares since starting as well.  She most recently presented to the emergency department on April 12 and was given additional prednisone.  She is currently on prednisone today and feels like this is helped her joints significantly.  Taking 15 mg.  Otherwise well without any new issues or complaints.    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 Re-presented 01/2021 and was [redacted]wks pregnant, was given pred monotherapy again. Started SSZ 02/2021, took for a few months but presented to [facility] ED on 05/2021 with flare, first saw [facility] 06/10/21 and started on MTX at that time.    Review of Systems:  Positive findings noted above, otherwise a 14 point review of systems was reviewed and negative    Past Medical, Surgical, Family and Social History reviewed and updated per EMR     Allergies:  Patient has no known allergies.    Medications:     Current Outpatient Medications:   ???  acetaminophen (TYLENOL) 325 MG tablet, Take 2 tablets (650 mg total) by mouth., Disp: , Rfl:   ???  ferrous sulfate 325 (65 FE) MG EC tablet, Take 1 tablet (325 mg total) by mouth daily., Disp: , Rfl:   ???  folic acid (FOLVITE) 1 MG tablet, Take 1 tablet (1 mg total) by mouth daily., Disp: 90 tablet, Rfl: 3  ???  methotrexate 2.5 MG tablet, Take 6 tablets (15 mg total) by mouth every seven (7) days for 14 days, THEN 8 tablets (20 mg total) every  seven (7) days., Disp: 108 tablet, Rfl: 0  ???  ENBREL SURECLICK 50 MG/ML (0.98 ML) SUBCUTANEOUS PEN, Inject the contents of 1 pen (50 mg total) under the skin every seven (7) days., Disp: 4 mL, Rfl: 3  ???  predniSONE (DELTASONE) 5 MG tablet, Take 2 tablets (10 mg total) by mouth daily., Disp: 60 tablet, Rfl: 1      Objective   Vitals:    09/07/21 1043   BP: 116/86   BP Site: L Arm   BP Position: Sitting   BP Cuff Size: Medium   Pulse: 98   Temp: 36.2 ??C (97.2 ??F)   TempSrc: Temporal   Weight: 86.5 kg (190 lb 12.8 oz)       Physical Exam  General: well appearing, no acute distress  Eyes: EOMI, normal conjunctivae   ENT: MMM.  Oropharynx without any erythema or exudate.  No oral or nasal ulcers.  Neck: supple. No cervical lymphadenopathy  Cardiovascular: Regular rate and rhythm. No murmurs, rubs or gallops.   Pulmonary: Clear to auscultation bilaterally. Normal work of breathing.  Skin: No rash, lesions, breakdown. No purpura or petechiae. No digital ulcers.   Extremities: Warm and well perfused, no cyanosis, clubbing or edema  Musculoskeletal: No synovitis or tenderness to palpation in the hands, wrists, elbows, shoulders, knees, ankles, or feet. Full ROM throughout.   Neurologic: Cranial nerves grossly intact, strength 5/5 throughout.  Normal sensation  Psychiatric: Normal mood and affect.

## 2021-09-08 NOTE — Unmapped (Signed)
[facility] Specialty Medication Onboarding    Specialty Medication: EnbreL SureClick 50 mg/mL (1 mL) Pnij (etanercept)  Prior Authorization: Approved   Financial Assistance: No - copay  <$25  Final Copay/Day Supply: $4 / 28    Insurance Restrictions: None     Notes to Pharmacist: n/a    The triage team has completed the benefits investigation and has determined that the patient is able to fill this medication at [facility]. Please contact the patient to complete the onboarding or follow up with the prescribing physician as needed.

## 2021-09-09 NOTE — Unmapped (Unsigned)
The [facility] has made a third and final attempt to reach this patient to refill the following medication:Enbrel.      We have spoke with patient to remind of labs    Dates contacted: 4/30, 5/9, 5/16  Last scheduled delivery: unable to onboard    The patient may be at risk of non-compliance with this medication. The patient should call the [facility] at [phone]  Option 4, then Option 2 (all other specialty patients) to refill medication.    Alicia Norris   [facility] Specialty Pharmacist least 15-30 minutes  5. Select injection site - you can use the front of your thigh or your belly (but not the area 2 inches around your belly button); if someone else is giving you the injection you can also use your upper arm in the skin covering your triceps muscle  6. Prepare injection site - wash your hands and clean the skin at the injection site with an alcohol swab and let it air dry, do not touch the injection site again before the injection  7. Pull off the needle safety cap, do not remove until immediately prior to injection  8. Pinch the skin - with your hand not holding the syringe pinch up a fold of skin at the injection site using your forefinger and thumb  9. Insert the needle into the fold of skin at about a 45 degree angle - it's best to use a quick dart-like motion - with the syringe in position, release the pinch of skin and allow the skin to relax  10. Push the plunger down slowly as far as it will go until the syringe is empty  11. Check that the syringe is empty pull the needle out at the same angle as inserted  12. Dispose of the used syringe immediately in your sharps disposal container  13. If you see any blood at the injection site, press a cotton ball or gauze on the site and maintain pressure until the bleeding stops, do not rub the injection site      Prefilled auto-injector pen  1. Gather all supplies needed for injection on a clean, flat working surface: medication pen removed from packaging, alcohol swab, sharps container, etc.  2. Look at the medication label - look for correct medication, correct dose, and check the expiration date  3. Look at the medication - the liquid visible in the window on the side of the pen device should appear clear and colorless; it is ok if you see small white particles in the medicine  4. Lay the auto-injector pen on a flat surface and allow it to warm up to room temperature for at least 30 minutes  5. Select injection site - you can use the front of your thigh or your belly (but not the area 2 inches around your belly button); if someone else is giving you the injection you can also use your upper arm in the skin covering your triceps muscle  6. Prepare injection site - wash your hands and clean the skin at the injection site with an alcohol swab and let it air dry, do not touch the injection site again before the injection  7. Pull off the white  safety cap, do not remove until immediately prior to injection and do not touch the needle shield  8. Pinch the skin - with your hand not holding the auto-injector pinch up a fold of skin at the injection  site using your forefinger and thumb to prepare a firm injection site  9. Put the needle shield against your skin at the injection site at a 90 degree angle, hold the pen such that you can see the clear medication window  10. To initiate the injection unlock the purple activation button by pressing the needle shield completely in against the injection site, then depress the purple activation button to start the injection - you will hear a click sound  11. Continue to hold the pen firmly against your skin for about 15 seconds - the window will start to turn solid yellow  12. There will be a second click sound when the injection is complete, verify the window is solid yellow before pulling the pen away from your skin  13. Dispose of the used auto-injector pen immediately in your sharps disposal container the needle will be covered automatically  14. If you see any blood at the injection site, press a cotton ball or gauze on the site and maintain pressure until the bleeding stops, do not rub the injection site    Auto-injector device with single-dose prefilled cartridges  1. Gather all supplies needed for injection on a clean, flat working surface: auto-injector device, medication cartridge removed from packaging, alcohol swabs, sharps container, etc.  2. Look at the medication label - look for correct medication, correct dose, and check the expiration date  3. Look at the medication - the liquid visible in the window on the side of the pen device should appear clear and colorless; it is ok if you see small white particles in the medicine  4. Lay the medication cartridge on a flat surface and allow it to warm up to room temperature for at least 30 minutes  5. Select injection site - you can use the front of your thigh or your belly (but not the area 2 inches around your belly button); if someone else is giving you the injection you can also use your upper arm in the skin covering your triceps muscle  6. Prepare injection site - wash your hands and clean the skin at the injection site with an alcohol swab and let it air dry, do not touch the injection site again before the injection  7. Wipe the injection end of the auto-injector device; press the door open button to open the door; hold the medication cartridge with the labeled side facing out and slide into the open door with the purple cap pointing down; close the door and confirm the viewing window light turns on  8. Hold the auto-injector device loaded with medication cartridge with fingers wrapped around the gray finger grip and pull the purple cap straight down and off  9. Place and hold the device on your skin at the prepared injection site and wait for the status button to turn green and a chime to sound  10. Press and release the status button - the butting will start to flash green and you will hear a click when the injection starts  11. The injection is finished when the green light turns off and a chime sounds; ensure you do not see a red status button light before lifting the device away from your skin  12. Once finished and removed from skin you will hear a motor noise for a few seconds and the cartridge door will automatically open  13. Remove the used medication cartridge and dispose immediately in your sharps disposal container  14. If you see any  blood at the injection site, press a cotton ball or gauze on the site and maintain pressure until the bleeding stops, do not rub the injection site  15. Clean and store the reusable auto-injector with another alcohol wipe before storing      Adherence/Missed dose instructions:  If your injection is given more than 2 days after your scheduled injection date - consult your pharmacist for additional instructions on how to adjust your dosing schedule.      Goals of Therapy     Ankylosing spondylitis  ??? Relief of symptoms  ??? Maintenance of function  ??? Prevention of complications of spinal disease  ??? Minimization of extraspinal and extraarticular manifestations and comorbidities  ??? Maintenance of effective psychosocial functioning    Plaque Psoriasis  ??? Minimize areas of skin involvement (% BSA)  ??? Avoidance of long term glucocorticoid use  ??? Maintenance of effective psychosocial functioning    Psoriatic arthritis  ??? Achieve remission/inactive disease or low/minimal disease activity  ??? Maintenance of function  ??? Minimization of systemic manifestations and comorbidities  ??? Maintenance of effective psychosocial functioning    Rheumatoid arthritis  ??? Achieve symptom remission  ??? Slow disease progression  ??? Protection of remaining articular structures  ??? Maintenance of function  ??? Maintenance of effective psychosocial functioning      Side Effects & Monitoring Parameters     ??? Injection site reaction (redness, irritation, inflammation localized to the site of administration)  ??? Signs of a common cold - minor sore throat, runny or stuffy nose, etc.  ??? Diarrhea    The following side effects should be reported to the provider:  ??? Signs of a hypersensitivity reaction - rash; hives; itching; red, swollen, blistered, or peeling skin; wheezing; tightness in the chest or throat; difficulty breathing, swallowing, or talking; swelling of the mouth, face, lips, tongue, or throat; etc.  ??? Reduced immune function - report signs of infection such as fever; chills; body aches; very bad sore throat; ear or sinus pain; cough; more sputum or change in color of sputum; pain with passing urine; wound that will not heal, etc.  Also at a slightly higher risk of some malignancies (mainly skin and blood cancers) due to this reduced immune function.  o In the case of signs of infection - the patient should hold the next dose of Enbrel?? and call your primary care provider to ensure adequate medical care.  Treatment may be resumed when infection is treated and patient is asymptomatic.  ??? Signs of unexplained bruising or bleeding - throwing up blood or emesis that looks like coffee grounds; black, tarry, or bloody stool; etc.  ??? Changes in skin - a new growth or lump that forms; changes in shape, size, or color of a previous mole or marking      Contraindications, Warnings, & Precautions     ??? Have your bloodwork checked as you have been told by your prescriber  ??? Talk with your doctor if you are pregnant, planning to become pregnant, or breastfeeding  ??? Discuss the possible need for holding your dose(s) of Enbrel?? when a planned procedure is scheduled with the prescriber as it may delay healing/recovery timeline       Drug/Food Interactions     ??? Medication list reviewed in Epic. The patient was instructed to inform the care team before taking any new medications or supplements. {Blank single:19197::No drug interactions identified,***}.   ??? If you have a latex allergy use caution when handling;  the needle cap of the Enbrel?? prefilled syringe, the safety cap for the Enbrel SureClick?? pen, and the needle cover within the purple cap of the Enbrel Mini?? cartridge contains a derivative of natural rubber latex  ??? Talk with you prescriber or pharmacist before receiving any live vaccinations while taking this medication and after you stop taking it    Storage, Handling Precautions, & Disposal     ??? Store this medication in the refrigerator.  Do not freeze  ??? If needed, you may store at room temperature for up to 30 days.  ??? Store in original packaging, protected from light  ??? Do not shake  ??? Dispose of used syringes/pens/cartridges in a sharps disposal container              Current Medications (including OTC/herbals), Comorbidities and Allergies     Current Outpatient Medications   Medication Sig Dispense Refill   ??? acetaminophen (TYLENOL) 325 MG tablet Take 2 tablets (650 mg total) by mouth.     ??? ENBREL SURECLICK 50 MG/ML (0.98 ML) SUBCUTANEOUS PEN Inject the contents of 1 pen (50 mg total) under the skin every seven (7) days. 4 mL 3   ??? ferrous sulfate 325 (65 FE) MG EC tablet Take 1 tablet (325 mg total) by mouth daily.     ??? folic acid (FOLVITE) 1 MG tablet Take 1 tablet (1 mg total) by mouth daily. 90 tablet 3   ??? methotrexate 2.5 MG tablet Take 6 tablets (15 mg total) by mouth every seven (7) days for 14 days, THEN 8 tablets (20 mg total) every seven (7) days. 108 tablet 0   ??? predniSONE (DELTASONE) 5 MG tablet Take 2 tablets (10 mg total) by mouth daily. 60 tablet 1     No current facility-administered medications for this visit.       No Known Allergies    Patient Active Problem List   Diagnosis   ??? Seropositive rheumatoid arthritis (CMS-HCC)       Reviewed and up to date in Epic.    Appropriateness of Therapy     Acute infections noted within Epic:  No active infections  Patient reported infection: {Blank single:19197::None,***- patient reported to provider,***- pharmacy reported to provider}    Is medication and dose appropriate based on diagnosis and infection status? {Blank single:19197::Yes,No - evidence provided by prescriber in *** note}    Prescription has been clinically reviewed: {Blank single:19197::Yes,***}      Baseline Quality of Life Assessment      {DiseaseSpecificQOL:73897}    Financial Information     Medication Assistance provided: Prior Authorization    Anticipated copay of $4 reviewed with patient. Verified delivery address.    Delivery Information     Scheduled delivery date: ***    Expected start date: ***    Medication will be delivered via {Blank:19197::UPS,Next Day Courier,Same Day Courier,Clinic Courier - *** clinic,***} to the {Blank:19197::prescription,temporary} address in Epic WAM.  This shipment {Blank single:19197::will,will not} require a signature.      Explained the services we provide at [facility] and that each month we would call to set up refills.  Stressed importance of returning phone calls so that we could ensure they receive their medications in time each month.  Informed patient that we should be setting up refills 7-10 days prior to when they will run out of medication.  A pharmacist will reach out to perform a clinical assessment periodically.  Informed patient that a welcome  packet, containing information about our pharmacy and other support services, a Notice of Privacy Practices, and a drug information handout will be sent.      The patient or caregiver noted above participated in the development of this care plan and knows that they can request review of or adjustments to the care plan at any time.      Patient or caregiver verbalized understanding of the above information as well as how to contact the pharmacy at [phone] option 4 with any questions/concerns.  The pharmacy is open Monday through Friday 8:30am-4:30pm.  A pharmacist is available 24/7 via pager to answer any clinical questions they may have.    Patient Specific Needs     - Does the patient have any physical, cognitive, or cultural barriers? {Blank single:19197::No,Yes - ***}    - Does the patient have adequate living arrangements? (i.e. the ability to store and take their medication appropriately) {Blank single:19197::Yes,No - ***}    - Did you identify any home environmental safety or security hazards? {Blank single:19197::No,Yes - ***}    - Patient prefers to have medications discussed with {Blank single:19197::Patient,Family Member,Caregiver,Other}     - Is the patient or caregiver able to read and understand education materials at a high school level or above? {Blank single:19197::No,Yes}    - Patient's primary language is  {Blank single:19197::English,Spanish,***}     - Is the patient high risk? {sschighriskpts:78327}    SOCIAL DETERMINANTS OF HEALTH     At the [facility], we have learned that life circumstances - like trouble affording food, housing, utilities, or transportation can affect the health of many of our patients.   That is why we wanted to ask: are you currently experiencing any life circumstances that are negatively impacting your health and/or quality of life? {YES/NO/PATIENTDECLINED:93004}    Social Determinants of Health     Financial Resource Strain: Not on file   Internet Connectivity: Not on file   Food Insecurity: Not on file   Tobacco Use: Low Risk    ??? Smoking Tobacco Use: Never   ??? Smokeless Tobacco Use: Never   ??? Passive Exposure: Never   Housing/Utilities: Not on file   Alcohol Use: Not on file   Transportation Needs: Not on file   Substance Use: Not on file   Health Literacy: Not on file   Physical Activity: Not on file   Interpersonal Safety: Not on file   Stress: Not on file   Intimate Partner Violence: Not on file   Depression: Not on file   Social Connections: Not on file       Would you be willing to receive help with any of the needs that you have identified today? {Yes/No/Not applicable:93005}       Alicia Norris  [facility] Specialty Pharmacist

## 2021-09-11 DIAGNOSIS — M059 Rheumatoid arthritis with rheumatoid factor, unspecified: Principal | ICD-10-CM

## 2021-09-12 NOTE — Unmapped (Signed)
quantiferon gold ordered.

## 2021-09-13 NOTE — Unmapped (Signed)
Addended by: Annita Brod on: 09/12/2021 05:42 PM     Modules accepted: Level of Service

## 2021-10-07 LAB — QUANTIFERON TB GOLD PLUS
QUANTIFERON MITOGEN VALUE: 3.72 [IU]/mL
QUANTIFERON NIL VALUE: 0.09 [IU]/mL
QUANTIFERON TB GOLD: NEGATIVE
QUANTIFERON TB1 AG VALUE: 0.02 [IU]/mL
QUANTIFERON TB2 AG VALUE: 0.03 [IU]/mL

## 2021-10-13 DIAGNOSIS — M059 Rheumatoid arthritis with rheumatoid factor, unspecified: Principal | ICD-10-CM

## 2021-10-13 MED ORDER — EMPTY CONTAINER
2 refills | 0 days
Start: 2021-10-13 — End: ?

## 2021-10-13 MED ORDER — PREDNISONE 5 MG TABLET
ORAL_TABLET | Freq: Every day | ORAL | 1 refills | 60.00000 days | Status: CP
Start: 2021-10-13 — End: 2021-10-13

## 2021-10-13 NOTE — Unmapped (Signed)
please send refill of Prednisone 5mg  to [facility] per pt request

## 2021-10-13 NOTE — Unmapped (Signed)
[facility]   Patient Onboarding/Medication Counseling    Alicia Norris is a 21 y.o. female with RA who I am counseling today on initiation of therapy.  I am speaking to the patient.    Was a Nurse, learning disability used for this call? No    Verified patient's date of birth / HIPAA.    Specialty medication(s) to be sent: Inflammatory Disorders: Enbrel      Non-specialty medications/supplies to be sent: sharps container       Medications not needed at this time: n/a       Enbrel (etanercept)        Medication & Administration     Dosage: Rheumatoid arthritis: Inject 50mg  under the skin one time weekly      Lab tests required prior to treatment initiation:  ??? Tuberculosis: Tuberculosis screening resulted in a non-reactive Quantiferon TB Gold assay.  ??? Hepatitis B: Hepatitis B serology studies are complete and non-reactive.      Administration:       Prefilled auto-injector pen  1. Gather all supplies needed for injection on a clean, flat working surface: medication pen removed from packaging, alcohol swab, sharps container, etc.  2. Look at the medication label - look for correct medication, correct dose, and check the expiration date  3. Look at the medication - the liquid visible in the window on the side of the pen device should appear clear and colorless; it is ok if you see small white particles in the medicine  4. Lay the auto-injector pen on a flat surface and allow it to warm up to room temperature for at least 30 minutes  5. Select injection site - you can use the front of your thigh or your belly (but not the area 2 inches around your belly button); if someone else is giving you the injection you can also use your upper arm in the skin covering your triceps muscle  6. Prepare injection site - wash your hands and clean the skin at the injection site with an alcohol swab and let it air dry, do not touch the injection site again before the injection  7. Pull off the white  safety cap, do not remove until immediately prior to injection and do not touch the needle shield  8. Pinch the skin - with your hand not holding the auto-injector pinch up a fold of skin at the injection site using your forefinger and thumb to prepare a firm injection site  9. Put the needle shield against your skin at the injection site at a 90 degree angle, hold the pen such that you can see the clear medication window  10. To initiate the injection unlock the purple activation button by pressing the needle shield completely in against the injection site, then depress the purple activation button to start the injection - you will hear a click sound  11. Continue to hold the pen firmly against your skin for about 15 seconds - the window will start to turn solid yellow  12. There will be a second click sound when the injection is complete, verify the window is solid yellow before pulling the pen away from your skin  13. Dispose of the used auto-injector pen immediately in your sharps disposal container the needle will be covered automatically  14. If you see any blood at the injection site, press a cotton ball or gauze on the site and maintain pressure until the bleeding stops, do not rub the injection site  Adherence/Missed dose instructions:  If your injection is given more than 2 days after your scheduled injection date - consult your pharmacist for additional instructions on how to adjust your dosing schedule.      Goals of Therapy       Rheumatoid arthritis  ??? Achieve symptom remission  ??? Slow disease progression  ??? Protection of remaining articular structures  ??? Maintenance of function  ??? Maintenance of effective psychosocial functioning      Side Effects & Monitoring Parameters     ??? Injection site reaction (redness, irritation, inflammation localized to the site of administration)  ??? Signs of a common cold - minor sore throat, runny or stuffy nose, etc.  ??? Diarrhea    The following side effects should be reported to the provider:  ??? Signs of a hypersensitivity reaction - rash; hives; itching; red, swollen, blistered, or peeling skin; wheezing; tightness in the chest or throat; difficulty breathing, swallowing, or talking; swelling of the mouth, face, lips, tongue, or throat; etc.  ??? Reduced immune function - report signs of infection such as fever; chills; body aches; very bad sore throat; ear or sinus pain; cough; more sputum or change in color of sputum; pain with passing urine; wound that will not heal, etc.  Also at a slightly higher risk of some malignancies (mainly skin and blood cancers) due to this reduced immune function.  o In the case of signs of infection - the patient should hold the next dose of Enbrel?? and call your primary care provider to ensure adequate medical care.  Treatment may be resumed when infection is treated and patient is asymptomatic.  ??? Signs of unexplained bruising or bleeding - throwing up blood or emesis that looks like coffee grounds; black, tarry, or bloody stool; etc.  ??? Changes in skin - a new growth or lump that forms; changes in shape, size, or color of a previous mole or marking      Contraindications, Warnings, & Precautions     ??? Have your bloodwork checked as you have been told by your prescriber  ??? Talk with your doctor if you are pregnant, planning to become pregnant, or breastfeeding  ??? Discuss the possible need for holding your dose(s) of Enbrel?? when a planned procedure is scheduled with the prescriber as it may delay healing/recovery timeline       Drug/Food Interactions     ??? Medication list reviewed in Epic. The patient was instructed to inform the care team before taking any new medications or supplements. No drug interactions identified.   ??? If you have a latex allergy use caution when handling; the needle cap of the Enbrel?? prefilled syringe, the safety cap for the Enbrel SureClick?? pen, and the needle cover within the purple cap of the Enbrel Mini?? cartridge contains a derivative of natural rubber latex  ??? Talk with you prescriber or pharmacist before receiving any live vaccinations while taking this medication and after you stop taking it    Storage, Handling Precautions, & Disposal     ??? Store this medication in the refrigerator.  Do not freeze  ??? If needed, you may store at room temperature for up to 30 days.  ??? Store in original packaging, protected from light  ??? Do not shake  ??? Dispose of used syringes/pens/cartridges in a sharps disposal container          Current Medications (including OTC/herbals), Comorbidities and Allergies     Current Outpatient Medications   Medication  Sig Dispense Refill   ??? acetaminophen (TYLENOL) 325 MG tablet Take 2 tablets (650 mg total) by mouth.     ??? ENBREL SURECLICK 50 MG/ML (0.98 ML) SUBCUTANEOUS PEN Inject the contents of 1 pen (50 mg total) under the skin every seven (7) days. 4 mL 3   ??? ferrous sulfate 325 (65 FE) MG EC tablet Take 1 tablet (325 mg total) by mouth daily.     ??? folic acid (FOLVITE) 1 MG tablet Take 1 tablet (1 mg total) by mouth daily. 90 tablet 3   ??? predniSONE (DELTASONE) 5 MG tablet Take 1 tablet (5 mg total) by mouth daily. 60 tablet 1     No current facility-administered medications for this visit.       No Known Allergies    Patient Active Problem List   Diagnosis   ??? Seropositive rheumatoid arthritis (CMS-HCC)       Reviewed and up to date in Epic.    Appropriateness of Therapy     Acute infections noted within Epic:  No active infections  Patient reported infection: None    Is medication and dose appropriate based on diagnosis and infection status? Yes    Prescription has been clinically reviewed: Yes      Baseline Quality of Life Assessment      How many days over the past month did your RA  keep you from your normal activities? For example, brushing your teeth or getting up in the morning. daily, cannot taper down on prednisone    Financial Information     Medication Assistance provided: Prior Authorization    Anticipated copay of $4 reviewed with patient. Verified delivery address.    Delivery Information     Scheduled delivery date: 6/6    Expected start date: 6/6    Medication will be delivered via UPS to the prescription address in Epic [place].  This shipment will not require a signature.      Explained the services we provide at [facility] and that each month we would call to set up refills.  Stressed importance of returning phone calls so that we could ensure they receive their medications in time each month.  Informed patient that we should be setting up refills 7-10 days prior to when they will run out of medication.  A pharmacist will reach out to perform a clinical assessment periodically.  Informed patient that a welcome packet, containing information about our pharmacy and other support services, a Notice of Privacy Practices, and a drug information handout will be sent.      The patient or caregiver noted above participated in the development of this care plan and knows that they can request review of or adjustments to the care plan at any time.      Patient or caregiver verbalized understanding of the above information as well as how to contact the pharmacy at [phone] option 4 with any questions/concerns.  The pharmacy is open Monday through Friday 8:30am-4:30pm.  A pharmacist is available 24/7 via pager to answer any clinical questions they may have.    Patient Specific Needs     - Does the patient have any physical, cognitive, or cultural barriers? No    - Does the patient have adequate living arrangements? (i.e. the ability to store and take their medication appropriately) Yes    - Did you identify any home environmental safety or security hazards? No    - Patient prefers to have medications discussed  with  Patient     - Is the patient or caregiver able to read and understand education materials at a high school level or above? Yes    - Patient's primary language is  English     - Is the patient high risk? No    SOCIAL DETERMINANTS OF HEALTH     At the [facility], we have learned that life circumstances - like trouble affording food, housing, utilities, or transportation can affect the health of many of our patients.   That is why we wanted to ask: are you currently experiencing any life circumstances that are negatively impacting your health and/or quality of life? Patient declined to answer    Social Determinants of Health     Financial Resource Strain: Not on file   Internet Connectivity: Not on file   Food Insecurity: Not on file   Tobacco Use: Low Risk    ??? Smoking Tobacco Use: Never   ??? Smokeless Tobacco Use: Never   ??? Passive Exposure: Never   Housing/Utilities: Not on file   Alcohol Use: Not on file   Transportation Needs: Not on file   Substance Use: Not on file   Health Literacy: Not on file   Physical Activity: Not on file   Interpersonal Safety: Not on file   Stress: Not on file   Intimate Partner Violence: Not on file   Depression: Not on file   Social Connections: Not on file       Would you be willing to receive help with any of the needs that you have identified today? Not applicable       [name]  [facility] Specialty Pharmacist

## 2021-10-13 NOTE — Unmapped (Signed)
Addended by: Arnette Felts on: 10/13/2021 11:34 AM     Modules accepted: Orders

## 2021-10-17 MED FILL — EMPTY CONTAINER: 90 days supply | Qty: 1 | Fill #0

## 2021-11-07 NOTE — Unmapped (Signed)
[facility] Clinical Assessment & Refill Coordination Note    For dose 2 and 3, patient experienced is a rash which is big (size of a lime) and itchy.  It did not resolve even by the next weeks dose. This is did not occur after the 1st injection but started after 2nd injection.       [facility], [place]: 2000-12-29  Phone: [phone] (home)     All above HIPAA information was verified with patient.     Was a Nurse, learning disability used for this call? No    Specialty Medication(s):   Inflammatory Disorders: Enbrel     Current Outpatient Medications   Medication Sig Dispense Refill   ??? acetaminophen (TYLENOL) 325 MG tablet Take 2 tablets (650 mg total) by mouth.     ??? empty container Misc USE AS DIRECTED 1 each 2   ??? ENBREL SURECLICK 50 MG/ML (0.98 ML) SUBCUTANEOUS PEN Inject the contents of 1 pen (50 mg total) under the skin every seven (7) days. 4 mL 3   ??? ferrous sulfate 325 (65 FE) MG EC tablet Take 1 tablet (325 mg total) by mouth daily.     ??? folic acid (FOLVITE) 1 MG tablet Take 1 tablet (1 mg total) by mouth daily. 90 tablet 3   ??? predniSONE (DELTASONE) 5 MG tablet Take 1 tablet (5 mg total) by mouth daily. 60 tablet 1     No current facility-administered medications for this visit.        Changes to medications: Alicia Norris reports no changes at this time.    No Known Allergies    Changes to allergies: No    SPECIALTY MEDICATION ADHERENCE         Medication Adherence    Patient reported X missed doses in the last month: 0  Specialty Medication: Enbrel q 7d  Patient is on additional specialty medications: No  Informant: patient          Specialty medication(s) dose(s) confirmed: Regimen is correct and unchanged.     Are there any concerns with adherence? No    Adherence counseling provided? Not needed    CLINICAL MANAGEMENT AND INTERVENTION      Clinical Benefit Assessment:    Do you feel the medicine is effective or helping your condition? Patient declined to answer    Clinical Benefit counseling provided? Reasonable expectations discussed: You can see benefits in as little as 2 or up to 12 weeks. Patient requesting more prednisone    Adverse Effects Assessment:    Are you experiencing any side effects? Yes, patient reports experiencing rash, see notes above. Side effect counseling provided: sent message to MD    Are you experiencing difficulty administering your medicine? No    Quality of Life Assessment:    Quality of Life    Rheumatology  1. What impact has your specialty medication had on the reduction of your daily pain level?: Minimal  2. What impact has your specialty medication had on your ability to complete daily tasks (prepare meals, get dressed, etc...)?Marland Kitchen Minimal  Oncology  Dermatology  Cystic Fibrosis            Have you discussed this with your provider? Not needed    Acute Infection Status:    Acute infections noted within Epic:  No active infections  Patient reported infection: None    Therapy Appropriateness:    Is therapy appropriate and patient progressing towards therapeutic goals? Pharmacist will consult provider  DISEASE/MEDICATION-SPECIFIC INFORMATION      For patients on injectable medications: Patient currently has 1 doses left.  Next injection is scheduled for 6/27.    PATIENT SPECIFIC NEEDS     - Does the patient have any physical, cognitive, or cultural barriers? No    - Is the patient high risk? No    - Does the patient require a Care Management Plan? No     SOCIAL DETERMINANTS OF HEALTH     At the [facility], we have learned that life circumstances - like trouble affording food, housing, utilities, or transportation can affect the health of many of our patients.   That is why we wanted to ask: are you currently experiencing any life circumstances that are negatively impacting your health and/or quality of life? Patient declined to answer    Social Determinants of Health     Financial Resource Strain: Not on file   Internet Connectivity: Not on file   Food Insecurity: Not on file   Tobacco Use: Low Risk    ??? Smoking Tobacco Use: Never   ??? Smokeless Tobacco Use: Never   ??? Passive Exposure: Never   Housing/Utilities: Not on file   Alcohol Use: Not on file   Transportation Needs: Not on file   Substance Use: Not on file   Health Literacy: Not on file   Physical Activity: Not on file   Interpersonal Safety: Not on file   Stress: Not on file   Intimate Partner Violence: Not on file   Depression: Not on file   Social Connections: Not on file       Would you be willing to receive help with any of the needs that you have identified today? Not applicable       SHIPPING     Specialty Medication(s) to be Shipped:   Inflammatory Disorders: Enbrel    Other medication(s) to be shipped: No additional medications requested for fill at this time     Changes to insurance: No    Delivery Scheduled: Yes, Expected medication delivery date: 6/29.     Medication will be delivered via UPS to the confirmed prescription address in [facility].    The patient will receive a drug information handout for each medication shipped and additional FDA Medication Guides as required.  Verified that patient has previously received a Conservation officer, historic buildings and a Surveyor, mining.    The patient or caregiver noted above participated in the development of this care plan and knows that they can request review of or adjustments to the care plan at any time.      All of the patient's questions and concerns have been addressed.    [name]   [facility] Specialty Pharmacist

## 2021-11-09 MED FILL — ENBREL SURECLICK 50 MG/ML (1 ML) SUBCUTANEOUS PEN INJECTOR: SUBCUTANEOUS | 28 days supply | Qty: 4 | Fill #1

## 2021-12-01 NOTE — Unmapped (Signed)
[facility] Refill Coordination Note    Specialty Medication(s) to be Shipped:   Inflammatory Disorders: Enbrel    Other medication(s) to be shipped: No additional medications requested for fill at this time     [facility], DOB: 07/10/2000  Phone: [phone] (home)       All above HIPAA information was verified with patient.     Was a Nurse, learning disability used for this call? No    Completed refill call assessment today to schedule patient's medication shipment from the [facility] [phone]).  All relevant notes have been reviewed.     Specialty medication(s) and dose(s) confirmed: Regimen is correct and unchanged.   Changes to medications: Liva reports no changes at this time.  Changes to insurance: No  New side effects reported not previously addressed with a pharmacist or physician: None reported  Questions for the pharmacist: No    Confirmed patient received a Conservation officer, historic buildings and a Surveyor, mining with first shipment. The patient will receive a drug information handout for each medication shipped and additional FDA Medication Guides as required.       DISEASE/MEDICATION-SPECIFIC INFORMATION        For patients on injectable medications: Patient currently has 1 doses left.  Next injection is scheduled for 7/25.    SPECIALTY MEDICATION ADHERENCE     Medication Adherence    Patient reported X missed doses in the last month: 0  Specialty Medication: Enbrel  Patient is on additional specialty medications: No  Patient is on more than two specialty medications: No  Any gaps in refill history greater than 2 weeks in the last 3 months: no  Demonstrates understanding of importance of adherence: yes  Informant: patient              Were doses missed due to medication being on hold? No    Enbrel 50mg /ml: Patient has 7 days of medication on hand    REFERRAL TO PHARMACIST     Referral to the pharmacist: Not needed      [facility]     Shipping address confirmed in Epic. Delivery Scheduled: Yes, Expected medication delivery date: 7/28.     Medication will be delivered via UPS to the prescription address in Epic [place].    [name]   [facility] Specialty Technician

## 2021-12-06 NOTE — Unmapped (Signed)
Received VM from patient stating that 5 mg of prednisone is not helping her pain go away. Patient requesting an increase to 10 mg daily instead.     Will forward to provider for advice.

## 2021-12-07 MED ORDER — PREDNISONE 10 MG TABLET
ORAL_TABLET | Freq: Every day | ORAL | 0 refills | 30 days | Status: CP
Start: 2021-12-07 — End: ?

## 2021-12-08 MED FILL — ENBREL SURECLICK 50 MG/ML (1 ML) SUBCUTANEOUS PEN INJECTOR: SUBCUTANEOUS | 28 days supply | Qty: 4 | Fill #2

## 2021-12-08 NOTE — Unmapped (Addendum)
Called patient to follow up her voicemail regarding flare on pred 5 mg. She tapered from 10 mg to 5 mg daily about 2 months ago and reported feeling well for 1 month afterwards. For the last month though has had significant joint pain and swelling at multiple joints bilaterally, namely MCPs/PIPs, wrists, toes, ankles, and intermittently knees. AM stiffness lasting 3 hours. Unable to hold her kids.     She started enbrel 2 months ago. Reports she is no longer taking methotrexate.    Her symptoms are consistent with RA flare. She reports doing well on 10 mg prednisone so we will increase to 10 mg daily until her next appointment. Rx sent. We discussed that this dose may not be enough in setting of acute flare, so she will reach out in a few days if symptoms are not controlled.     I will also clarify plan for methotrexate with her primary rheumatologist Dr. Tomasa Rand as the last note says to continue. Has follow up appointment with Dr. Tomasa Rand August 9.

## 2021-12-21 ENCOUNTER — Ambulatory Visit
Admit: 2021-12-21 | Discharge: 2021-12-22 | Payer: PRIVATE HEALTH INSURANCE | Attending: Student in an Organized Health Care Education/Training Program | Primary: Student in an Organized Health Care Education/Training Program

## 2021-12-21 DIAGNOSIS — M059 Rheumatoid arthritis with rheumatoid factor, unspecified: Principal | ICD-10-CM

## 2021-12-21 LAB — CBC W/ AUTO DIFF
BASOPHILS ABSOLUTE COUNT: 0.1 10*9/L (ref 0.0–0.1)
BASOPHILS RELATIVE PERCENT: 0.7 %
EOSINOPHILS ABSOLUTE COUNT: 0 10*9/L (ref 0.0–0.5)
EOSINOPHILS RELATIVE PERCENT: 0 %
HEMATOCRIT: 37.2 % (ref 34.0–44.0)
HEMOGLOBIN: 12.3 g/dL (ref 11.3–14.9)
LYMPHOCYTES ABSOLUTE COUNT: 1.4 10*9/L (ref 1.1–3.6)
LYMPHOCYTES RELATIVE PERCENT: 12.9 %
MEAN CORPUSCULAR HEMOGLOBIN CONC: 33.2 g/dL (ref 32.0–36.0)
MEAN CORPUSCULAR HEMOGLOBIN: 25 pg — ABNORMAL LOW (ref 25.9–32.4)
MEAN CORPUSCULAR VOLUME: 75.3 fL — ABNORMAL LOW (ref 77.6–95.7)
MEAN PLATELET VOLUME: 6.9 fL (ref 6.8–10.7)
MONOCYTES ABSOLUTE COUNT: 0.2 10*9/L — ABNORMAL LOW (ref 0.3–0.8)
MONOCYTES RELATIVE PERCENT: 2.2 %
NEUTROPHILS ABSOLUTE COUNT: 9.1 10*9/L — ABNORMAL HIGH (ref 1.8–7.8)
NEUTROPHILS RELATIVE PERCENT: 84.2 %
NUCLEATED RED BLOOD CELLS: 0 /100{WBCs} (ref <=4)
PLATELET COUNT: 525 10*9/L — ABNORMAL HIGH (ref 150–450)
RED BLOOD CELL COUNT: 4.94 10*12/L (ref 3.95–5.13)
RED CELL DISTRIBUTION WIDTH: 15.8 % — ABNORMAL HIGH (ref 12.2–15.2)
WBC ADJUSTED: 10.8 10*9/L (ref 3.6–11.2)

## 2021-12-21 LAB — C-REACTIVE PROTEIN: C-REACTIVE PROTEIN: 19 mg/L — ABNORMAL HIGH (ref <=10.0)

## 2021-12-21 LAB — ALT: ALT (SGPT): 22 U/L (ref 10–49)

## 2021-12-21 LAB — AST: AST (SGOT): 15 U/L (ref <=34)

## 2021-12-21 LAB — CREATININE
CREATININE: 0.62 mg/dL
EGFR CKD-EPI (2021) FEMALE: >90 mL/min/{1.73_m2} (ref >=60)

## 2021-12-21 LAB — SEDIMENTATION RATE: ERYTHROCYTE SEDIMENTATION RATE: 60 mm/h — ABNORMAL HIGH (ref 0–20)

## 2021-12-21 NOTE — Unmapped (Signed)
Rheumatology Clinic Follow-up Note     Assessment/Plan:    Alicia Norris is a 21 y.o.  female with a PMH of seropositive RA who presents today for follow up of RA.    Initially started on MTX with improvement but remained active despite 20mg  PO. Ultimately started on Enbrel 09/2021, she has self-d/c'd MTX. Currently on Enbrel monotherapy.    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 Will continue Enbrel monotherapy.  - Enbrel qweekly  - OFF pred and MTX  - CBC w/ diff, Cr, AST, ALT    Return in about 6 months (around 06/23/2022).    [name], MD   Assistant Professor of Medicine  Department of Medicine/Division of Rheumatology  [facility]    Primary Care Provider: [facility]    HPI:  Alicia Norris is a 21 y.o.  female with a PMH of seropositive RA who presents today for follow up of RA.    Today  Currently off prednisone and self d/c'd MTX (thought she was switching from MTX). Took ~56mo but now feels she is very well controlled with no disease activity. Tolerating well without side effects. No new complaints today.    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 Re-presented 01/2021 and was [redacted]wks pregnant, was given pred monotherapy again. Started SSZ 02/2021, took for a few months but presented to [place] ED on 05/2021 with flare, first saw [facility] 06/10/21 and started on MTX at that time. On MTX 20mg  PO she had significant improvement but remained active, Enbrel added ~09/2021.    Review of Systems:  Positive findings noted above, otherwise a 14 point review of systems was reviewed and negative    Past Medical, Surgical, Family and Social History reviewed and updated per EMR     Allergies:  Patient has no known allergies.    Medications:     Current Outpatient Medications:   ???  acetaminophen (TYLENOL) 325 MG tablet, Take 2 tablets (650 mg total) by mouth., Disp: , Rfl:   ???  empty container Misc, USE AS DIRECTED, Disp: 1 each, Rfl: 2  ???  ENBREL SURECLICK 50 MG/ML (0.98 ML) SUBCUTANEOUS PEN, Inject the contents of 1 pen (50 mg total) under the skin every seven (7) days., Disp: 4 mL, Rfl: 3  ???  predniSONE (DELTASONE) 10 MG tablet, Take 1 tablet (10 mg total) by mouth daily., Disp: 30 tablet, Rfl: 0  ???  ferrous sulfate 325 (65 FE) MG EC tablet, Take 1 tablet (325 mg total) by mouth daily. (Patient not taking: Reported on 12/21/2021), Disp: , Rfl:   ???  folic acid (FOLVITE) 1 MG tablet, Take 1 tablet (1 mg total) by mouth daily. (Patient not taking: Reported on 12/21/2021), Disp: 90 tablet, Rfl: 3      Objective   Vitals:    12/21/21 0844   BP: 107/82   BP Site: L Arm   BP Position: Sitting   BP Cuff Size: Medium   Pulse: 109   Temp: 36.2 ??C (97.2 ??F)   TempSrc: Temporal   Weight: 85.7 kg (189 lb)   Height: 154.9 cm (5' 1)         Physical Exam  General: well appearing, no acute distress  Eyes: EOMI, normal conjunctivae   ENT: MMM.  Oropharynx without any erythema or exudate.  No  oral or nasal ulcers.  Neck: supple. No cervical lymphadenopathy  Cardiovascular: Regular rate and rhythm. No murmurs, rubs or gallops.   Pulmonary: Clear to auscultation bilaterally. Normal work of breathing.  Skin: No rash, lesions, breakdown. No purpura or petechiae. No digital ulcers.   Extremities: Warm and well perfused, no cyanosis, clubbing or edema  Musculoskeletal: No synovitis or tenderness to palpation in the hands, wrists, elbows, shoulders, knees, ankles, or feet. Full ROM throughout.   Neurologic: Cranial nerves grossly intact, strength 5/5 throughout.  Normal sensation  Psychiatric: Normal mood and affect.

## 2022-01-17 NOTE — Unmapped (Signed)
The [facility] has made a third and final attempt to reach this patient to refill the following medication:Enbrel.      We have left voicemails on the following phone numbers: [phone] and have sent a text message to the following phone numbers: [phone].    Dates contacted: 8/18,8/22,9/5  Last scheduled delivery: 7/27    The patient may be at risk of non-compliance with this medication. The patient should call the [facility] at [phone]  Option 4, then Option 2 (all other specialty patients) to refill medication.    [name]   [facility] Specialty Technician

## 2022-01-18 NOTE — Unmapped (Signed)
[facility] Refill Coordination Note    Specialty Medication(s) to be Shipped:   Inflammatory Disorders: Enbrel    Other medication(s) to be shipped: No additional medications requested for fill at this time     [facility], DOB: 05-13-2001  Phone: [phone] (home)       All above HIPAA information was verified with patient.     Was a Nurse, learning disability used for this call? No    Completed refill call assessment today to schedule patient's medication shipment from the [facility] [phone]).  All relevant notes have been reviewed.     Specialty medication(s) and dose(s) confirmed: Regimen is correct and unchanged.   Changes to medications: Estalene reports no changes at this time.  Changes to insurance: No  New side effects reported not previously addressed with a pharmacist or physician: None reported  Questions for the pharmacist: Yes: After missing a weeks dose    Confirmed patient received a Conservation officer, historic buildings and a Surveyor, mining with first shipment. The patient will receive a drug information handout for each medication shipped and additional FDA Medication Guides as required.       DISEASE/MEDICATION-SPECIFIC INFORMATION        For patients on injectable medications: Patient currently has 0 doses left.  Next injection is scheduled for 01/17/22.    SPECIALTY MEDICATION ADHERENCE     Medication Adherence    Patient reported X missed doses in the last month: 0  Specialty Medication: Enbrel 50mg /ml  Patient is on additional specialty medications: No  Patient is on more than two specialty medications: No                          Were doses missed due to medication being on hold? No    Enbrel  50 mg/ml: 0 days of medicine on hand        REFERRAL TO PHARMACIST     Referral to the pharmacist: Not needed      [facility]     Shipping address confirmed in Epic.     Delivery Scheduled: Yes, Expected medication delivery date: 01/20/22.     Medication will be delivered via UPS to the prescription address in Epic WAM.    Nancy Nordmann [facility] Specialty Technician

## 2022-01-20 DIAGNOSIS — M0579 Rheumatoid arthritis with rheumatoid factor of multiple sites without organ or systems involvement: Principal | ICD-10-CM

## 2022-01-20 MED ORDER — ENBREL SURECLICK 50 MG/ML (1 ML) SUBCUTANEOUS PEN INJECTOR
SUBCUTANEOUS | 11 refills | 28 days | Status: CP
Start: 2022-01-20 — End: ?
  Filled 2022-01-19: qty 4, 28d supply, fill #3
  Filled 2022-02-20: qty 4, 28d supply, fill #0

## 2022-01-20 NOTE — Unmapped (Signed)
Enbrel refill  Last Visit Date: 12/21/2021  Next Visit Date: 06/23/2022    Lab Results   Component Value Date    ALT 22 12/21/2021    AST 15 12/21/2021    ALBUMIN 3.6 09/07/2021    CREATININE 0.62 12/21/2021     Lab Results   Component Value Date    WBC 10.8 12/21/2021    HGB 12.3 12/21/2021    HCT 37.2 12/21/2021    PLT 525 (H) 12/21/2021     Lab Results   Component Value Date    NEUTROPCT 84.2 12/21/2021    LYMPHOPCT 12.9 12/21/2021    MONOPCT 2.2 12/21/2021    EOSPCT 0.0 12/21/2021    BASOPCT 0.7 12/21/2021

## 2022-02-10 NOTE — Unmapped (Signed)
[facility] Refill Coordination Note    Specialty Medication(s) to be Shipped:   Inflammatory Disorders: Enbrel    Other medication(s) to be shipped: No additional medications requested for fill at this time     [facility], DOB: Feb 17, 2001  Phone: [phone] (home)       All above HIPAA information was verified with patient.     Was a Nurse, learning disability used for this call? No    Completed refill call assessment today to schedule patient's medication shipment from the [facility] [phone]).  All relevant notes have been reviewed.     Specialty medication(s) and dose(s) confirmed: Regimen is correct and unchanged.   Changes to medications: Yancey reports no changes at this time.  Changes to insurance: No  New side effects reported not previously addressed with a pharmacist or physician: None reported  Questions for the pharmacist: No    Confirmed patient received a Conservation officer, historic buildings and a Surveyor, mining with first shipment. The patient will receive a drug information handout for each medication shipped and additional FDA Medication Guides as required.       DISEASE/MEDICATION-SPECIFIC INFORMATION        For patients on injectable medications: Patient currently has 1 doses left.  Next injection is scheduled for 10/6.    SPECIALTY MEDICATION ADHERENCE     Medication Adherence    Patient reported X missed doses in the last month: 0  Specialty Medication: Enbrel  Patient is on additional specialty medications: No  Patient is on more than two specialty medications: No  Any gaps in refill history greater than 2 weeks in the last 3 months: no  Demonstrates understanding of importance of adherence: yes  Informant: patient                          Were doses missed due to medication being on hold? No    Enbrel 50mg /ml: Patient has 7 days of medication on hand    REFERRAL TO PHARMACIST     Referral to the pharmacist: Not needed      [facility]     Shipping address confirmed in Epic.     Delivery Scheduled: Yes, Expected medication delivery date: 10/10.     Medication will be delivered via UPS to the prescription address in Epic WAM.    [name]   [facility] Specialty Technician

## 2022-03-15 NOTE — Unmapped (Signed)
[facility] Refill Coordination Note    Specialty Medication(s) to be Shipped:   Inflammatory Disorders: Enbrel    Other medication(s) to be shipped: No additional medications requested for fill at this time     [facility], DOB: 22-Aug-2000  Phone: [phone] (home)       All above HIPAA information was verified with patient.     Was a Nurse, learning disability used for this call? No    Completed refill call assessment today to schedule patient's medication shipment from the [facility] [phone]).  All relevant notes have been reviewed.     Specialty medication(s) and dose(s) confirmed: Regimen is correct and unchanged.   Changes to medications: [name] reports no changes at this time.  Changes to insurance: No  New side effects reported not previously addressed with a pharmacist or physician: None reported  Questions for the pharmacist: No    Confirmed patient received a Conservation officer, historic buildings and a Surveyor, mining with first shipment. The patient will receive a drug information handout for each medication shipped and additional FDA Medication Guides as required.       DISEASE/MEDICATION-SPECIFIC INFORMATION        For patients on injectable medications: Patient currently has 0 doses left.  Next injection is scheduled for 11/3.    SPECIALTY MEDICATION ADHERENCE     Medication Adherence    Patient reported X missed doses in the last month: 0  Specialty Medication: Enbrel  Patient is on additional specialty medications: No  Patient is on more than two specialty medications: No  Any gaps in refill history greater than 2 weeks in the last 3 months: no  Demonstrates understanding of importance of adherence: yes  Informant: patient                          Were doses missed due to medication being on hold? No    Enbrel 50mg /ml: Patient has 0 days of medication on hand     REFERRAL TO PHARMACIST     Referral to the pharmacist: Not needed      [facility]     Shipping address confirmed in Epic.     Delivery Scheduled: Yes, Expected medication delivery date: 11/3.     Medication will be delivered via UPS to the prescription address in Epic WAM.    Alicia Norris   [facility] Specialty Technician

## 2022-03-16 MED FILL — ENBREL SURECLICK 50 MG/ML (1 ML) SUBCUTANEOUS PEN INJECTOR: SUBCUTANEOUS | 28 days supply | Qty: 4 | Fill #1

## 2022-03-21 MED ORDER — PREDNISONE 5 MG TABLET
ORAL_TABLET | Freq: Every day | ORAL | 5 refills | 30 days | Status: CP
Start: 2022-03-21 — End: ?

## 2022-03-21 NOTE — Unmapped (Signed)
Prednisone refill  Last ov: 12/21/2021  Next ov: 06/23/2022

## 2022-04-11 NOTE — Unmapped (Signed)
[facility] Refill Coordination Note    Specialty Medication(s) to be Shipped:   Inflammatory Disorders: Enbrel    Other medication(s) to be shipped: No additional medications requested for fill at this time     [facility], DOB: 2000-06-23  Phone: [phone] (home)       All above HIPAA information was verified with patient.     Was a Nurse, learning disability used for this call? No    Completed refill call assessment today to schedule patient's medication shipment from the [facility] [phone]).  All relevant notes have been reviewed.     Specialty medication(s) and dose(s) confirmed: Regimen is correct and unchanged.   Changes to medications: Alicia Norris reports no changes at this time.  Changes to insurance: No  New side effects reported not previously addressed with a pharmacist or physician: None reported  Questions for the pharmacist: No    Confirmed patient received a Conservation officer, historic buildings and a Surveyor, mining with first shipment. The patient will receive a drug information handout for each medication shipped and additional FDA Medication Guides as required.       DISEASE/MEDICATION-SPECIFIC INFORMATION        For patients on injectable medications: Patient currently has 0 doses left.  Next injection is scheduled for 12/1.    SPECIALTY MEDICATION ADHERENCE     Medication Adherence    Patient reported X missed doses in the last month: 0  Specialty Medication: EnbreL SureClick 50 mg/mL (1 mL) Pnij (etanercept)  Patient is on additional specialty medications: No                          Were doses missed due to medication being on hold? No    Enbrel 50 mg/ml: 0 days of medicine on hand        REFERRAL TO PHARMACIST     Referral to the pharmacist: Not needed      [facility]     Shipping address confirmed in Epic.     Delivery Scheduled: Yes, Expected medication delivery date: 04/14/22.     Medication will be delivered via UPS to the prescription address in Epic WAM.    Willette Pa   [facility] Specialty Technician

## 2022-04-13 MED FILL — ENBREL SURECLICK 50 MG/ML (1 ML) SUBCUTANEOUS PEN INJECTOR: SUBCUTANEOUS | 28 days supply | Qty: 4 | Fill #2

## 2022-05-23 NOTE — Unmapped (Signed)
[facility] Refill Coordination Note    Specialty Medication(s) to be Shipped:   Inflammatory Disorders: Enbrel    Other medication(s) to be shipped: No additional medications requested for fill at this time     [facility], DOB: 06-05-2000  Phone: [phone] (home)       All above HIPAA information was verified with patient.     Was a Nurse, learning disability used for this call? No    Completed refill call assessment today to schedule patient's medication shipment from the [facility] [phone]).  All relevant notes have been reviewed.     Specialty medication(s) and dose(s) confirmed: Regimen is correct and unchanged.   Changes to medications: Farra reports no changes at this time.  Changes to insurance: No  New side effects reported not previously addressed with a pharmacist or physician: None reported  Questions for the pharmacist: No    Confirmed patient received a Conservation officer, historic buildings and a Surveyor, mining with first shipment. The patient will receive a drug information handout for each medication shipped and additional FDA Medication Guides as required.       DISEASE/MEDICATION-SPECIFIC INFORMATION        For patients on injectable medications: Patient currently has 0 doses left.  Next injection is scheduled for 05/25/22 .    SPECIALTY MEDICATION ADHERENCE     Medication Adherence    Patient reported X missed doses in the last month: 0  Specialty Medication: EnbreL SureClick 50 mg/mL (1 mL) Pnij (etanercept)  Patient is on additional specialty medications: No  Patient is on more than two specialty medications: No  Any gaps in refill history greater than 2 weeks in the last 3 months: no  Demonstrates understanding of importance of adherence: yes                                Were doses missed due to medication being on hold? No    Enbrel 50 mg/ml: 0 days of medicine on hand        REFERRAL TO PHARMACIST     Referral to the pharmacist: Not needed      [facility] Shipping address confirmed in Epic.     Delivery Scheduled: Yes, Expected medication delivery date: 05/25/22 .     Medication will be delivered via UPS to the prescription address in Epic WAM.    Ricci Barker   [facility] Specialty Technician

## 2022-05-24 MED FILL — ENBREL SURECLICK 50 MG/ML (1 ML) SUBCUTANEOUS PEN INJECTOR: SUBCUTANEOUS | 28 days supply | Qty: 4 | Fill #3

## 2022-06-20 ENCOUNTER — Emergency Department (HOSPITAL_COMMUNITY)
Admission: EM | Admit: 2022-06-20 | Discharge: 2022-06-21 | Disposition: A | Discharge status: Home / Self Care | Payer: Medicaid Other | Attending: Emergency Medicine | Admitting: Emergency Medicine

## 2022-06-20 ENCOUNTER — Other Ambulatory Visit: Payer: Self-pay

## 2022-06-20 ENCOUNTER — Emergency Department (HOSPITAL_COMMUNITY): Payer: Medicaid Other

## 2022-06-20 DIAGNOSIS — R059 Cough, unspecified: Secondary | ICD-10-CM | POA: Insufficient documentation

## 2022-06-20 DIAGNOSIS — R791 Abnormal coagulation profile: Secondary | ICD-10-CM | POA: Insufficient documentation

## 2022-06-20 DIAGNOSIS — R0789 Other chest pain: Secondary | ICD-10-CM | POA: Insufficient documentation

## 2022-06-20 DIAGNOSIS — R0781 Pleurodynia: Secondary | ICD-10-CM | POA: Insufficient documentation

## 2022-06-20 LAB — CBC WITH DIFFERENTIAL/PLATELET
Abs Immature Granulocytes: 0.02 10*3/uL (ref 0.00–0.07)
Basophils Absolute: 0 10*3/uL (ref 0.0–0.1)
Basophils Relative: 1 %
Eosinophils Absolute: 0.3 10*3/uL (ref 0.0–0.5)
Eosinophils Relative: 4 %
HCT: 36.5 % (ref 36.0–46.0)
Hemoglobin: 11.7 g/dL — ABNORMAL LOW (ref 12.0–15.0)
Immature Granulocytes: 0 %
Lymphocytes Relative: 43 %
Lymphs Abs: 3.4 10*3/uL (ref 0.7–4.0)
MCH: 25.4 pg — ABNORMAL LOW (ref 26.0–34.0)
MCHC: 32.1 g/dL (ref 30.0–36.0)
MCV: 79.2 fL — ABNORMAL LOW (ref 80.0–100.0)
Monocytes Absolute: 0.4 10*3/uL (ref 0.1–1.0)
Monocytes Relative: 5 %
Neutro Abs: 3.8 10*3/uL (ref 1.7–7.7)
Neutrophils Relative %: 47 %
Platelets: 421 10*3/uL — ABNORMAL HIGH (ref 150–400)
RBC: 4.61 MIL/uL (ref 3.87–5.11)
RDW: 15.4 % (ref 11.5–15.5)
WBC: 7.9 10*3/uL (ref 4.0–10.5)
nRBC: 0 % (ref 0.0–0.2)

## 2022-06-20 LAB — COMPREHENSIVE METABOLIC PANEL
ALT: 19 U/L (ref 0–44)
AST: 24 U/L (ref 15–41)
Albumin: 3.6 g/dL (ref 3.5–5.0)
Alkaline Phosphatase: 67 U/L (ref 38–126)
Anion gap: 8 (ref 5–15)
BUN: 13 mg/dL (ref 6–20)
CO2: 23 mmol/L (ref 22–32)
Calcium: 8.7 mg/dL — ABNORMAL LOW (ref 8.9–10.3)
Chloride: 108 mmol/L (ref 98–111)
Creatinine, Ser: 0.82 mg/dL (ref 0.44–1.00)
GFR, Estimated: >60 mL/min (ref >60)
Glucose, Bld: 88 mg/dL (ref 70–99)
Potassium: 3.6 mmol/L (ref 3.5–5.1)
Sodium: 139 mmol/L (ref 135–145)
Total Bilirubin: 0.4 mg/dL (ref 0.3–1.2)
Total Protein: 6.9 g/dL (ref 6.5–8.1)

## 2022-06-20 LAB — I-STAT BETA HCG BLOOD, ED (MC, WL, AP ONLY): I-stat hCG, quantitative: <5 m[IU]/mL (ref <5)

## 2022-06-20 NOTE — ED Triage Notes (Signed)
Patient reports intermittent right lateral ribcage pain for 2 weeks worse with movement /changing positions , respirations unlabored , denies injury or fall.

## 2022-06-20 NOTE — ED Provider Triage Note (Signed)
Emergency Medicine Provider Triage Evaluation Note  Theresa Todd , a 22 y.o. female  was evaluated in triage.  Pt complains of right-sided rib pain for the past 2 weeks but gradually worsening the past few days.  She denies any injury or trauma to the area.  She reports that initially fell of a bruise within it has been worsening.  She reports this only hurts with deep pressure with the palm.  No tenderness to her belly.  No nausea, vomiting, chest pain, or shortness of breath.  Pain is not worsened upon inspiration.  She has not tried anything for pain.  Review of Systems  Positive:  Negative:   Physical Exam  BP 115/72 (BP Location: Right Arm)   Pulse 92   Temp 98.6 F (37 C) (Oral)   Resp 16   LMP 06/17/2022   SpO2 99%  Gen:   Awake, no distress   Resp:  Normal effort  MSK:   Moves extremities without difficulty  Other:  Tenderness to the right lower rib cage. No abdominal tenderness or RUQ tenderness. No overlying skin changes other than stretch marks noted.   Medical Decision Making  Medically screening exam initiated at 8:10 PM.  Appropriate orders placed.  Loyce P Satterwhite was informed that the remainder of the evaluation will be completed by another provider, this initial triage assessment does not replace that evaluation, and the importance of remaining in the ED until their evaluation is complete.  XR and labs ordered   Sherrell Puller, Hershal Coria 06/20/22 2012

## 2022-06-21 ENCOUNTER — Emergency Department (HOSPITAL_COMMUNITY): Payer: Medicaid Other

## 2022-06-21 LAB — D-DIMER, QUANTITATIVE: D-Dimer, Quant: 1.28 ug/mL-FEU — ABNORMAL HIGH (ref 0.00–0.50)

## 2022-06-21 MED ORDER — IOHEXOL 350 MG/ML SOLN
75.0000 mL | Freq: Once | INTRAVENOUS | Status: AC | PRN
  Administered 2022-06-21: 75 mL via INTRAVENOUS

## 2022-06-21 MED ORDER — NAPROXEN 500 MG PO TABS: 500.0000 mg | ORAL_TABLET | Freq: Two times a day (BID) | ORAL | 0 refills | Status: DC

## 2022-06-21 MED ORDER — KETOROLAC TROMETHAMINE 30 MG/ML IJ SOLN
30.0000 mg | Freq: Once | INTRAMUSCULAR | Status: AC
Start: 2022-06-21 — End: 2022-06-21
  Administered 2022-06-21: 30 mg via INTRAMUSCULAR
  Filled 2022-06-21: qty 1

## 2022-06-21 NOTE — ED Provider Notes (Signed)
[facility] Provider Note   CSN: [id number] Arrival date & time: 06/20/22  1950     History  Chief Complaint  Patient presents with   Right Ribcage Pain     Theresa Todd is a 22 y.o. female.  HPI     This is a 22 year old female who presents with right-sided rib pain.  Patient reports progressively worsening right lower rib pain.  States that it got acutely worse on Saturday.  She denies any injury.  It is worse with certain movements.  It is not worse with deep breathing.  Denies any recent fevers.  She does have a cough.  Denies abdominal pain or worsening with food.    Home Medications Prior to Admission medications   Medication Sig Start Date End Date Taking? Authorizing Provider  naproxen (NAPROSYN) 500 MG tablet Take 1 tablet (500 mg total) by mouth 2 (two) times daily. 06/21/22  Yes Richanda Darin, Barbette Hair, MD  acetaminophen (TYLENOL) 325 MG tablet Take 2 tablets (650 mg total) by mouth every 6 (six) hours as needed. 9/32/67   Lianne Cure, DO  gabapentin (NEURONTIN) 300 MG capsule 1 tab nightly and as needed for headache 06/24/15   Rocky Link, MD  lidocaine (LIDODERM) 5 % Place 1 patch onto the skin daily. Remove & Discard patch within 12 hours or as directed by MD 08/24/21   Blue, Soijett A, PA-C  promethazine (PHENERGAN) 12.5 MG tablet 1 tablet every 6 hours as needed for headache. 06/24/15   Rocky Link, MD      Allergies    Patient has no known allergies.    Review of Systems   Review of Systems  Constitutional:  Negative for fever.  Respiratory:  Negative for shortness of breath.   Cardiovascular:  Positive for chest pain.  Gastrointestinal:  Negative for abdominal pain, nausea and vomiting.  All other systems reviewed and are negative.   Physical Exam Updated Vital Signs BP 108/81   Pulse 86   Temp 98.9 F (37.2 C) (Oral)   Resp 18   LMP 06/17/2022   SpO2 98%  Physical Exam Vitals and nursing note  reviewed.  Constitutional:      Appearance: She is well-developed. She is obese.  HENT:     Head: Normocephalic and atraumatic.     Mouth/Throat:     Mouth: Mucous membranes are moist.  Eyes:     Pupils: Pupils are equal, round, and reactive to light.  Cardiovascular:     Rate and Rhythm: Normal rate and regular rhythm.     Heart sounds: Normal heart sounds.  Pulmonary:     Effort: Pulmonary effort is normal. No respiratory distress.     Breath sounds: No wheezing.     Comments: Tenderness to palpation right lower rib Chest:     Chest wall: Tenderness present.  Abdominal:     General: Bowel sounds are normal.     Palpations: Abdomen is soft.     Tenderness: There is no abdominal tenderness. There is no guarding or rebound.  Musculoskeletal:     Cervical back: Neck supple.  Skin:    General: Skin is warm and dry.  Neurological:     Mental Status: She is alert and oriented to person, place, and time.     ED Results / Procedures / Treatments   Labs (all labs ordered are listed, but only abnormal results are displayed) Labs Reviewed  CBC WITH DIFFERENTIAL/PLATELET -  Abnormal; Notable for the following components:      Result Value   Hemoglobin 11.7 (*)    MCV 79.2 (*)    MCH 25.4 (*)    Platelets 421 (*)    All other components within normal limits  COMPREHENSIVE METABOLIC PANEL - Abnormal; Notable for the following components:   Calcium 8.7 (*)    All other components within normal limits  D-DIMER, QUANTITATIVE - Abnormal; Notable for the following components:   D-Dimer, Quant 1.28 (*)    All other components within normal limits  I-STAT BETA HCG BLOOD, ED ([facility], [facility], [facility] ONLY)    EKG ED ECG REPORT   Date: 06/21/2022  Rate: 81  Rhythm: normal sinus rhythm  QRS Axis: normal  Intervals: normal  ST/T Wave abnormalities: normal  Conduction Disutrbances:none  Narrative Interpretation:   Old EKG Reviewed: none available  I have personally reviewed the EKG tracing  and agree with the computerized printout as noted.   Radiology CT Angio Chest PE W and/or Wo Contrast  Result Date: 06/21/2022 CLINICAL DATA:  Pulmonary embolism (PE) suspected, low to intermediate prob, positive D-dimer EXAM: CT ANGIOGRAPHY CHEST WITH CONTRAST TECHNIQUE: Multidetector CT imaging of the chest was performed using the standard protocol during bolus administration of intravenous contrast. Multiplanar CT image reconstructions and MIPs were obtained to evaluate the vascular anatomy. RADIATION DOSE REDUCTION: This exam was performed according to the departmental dose-optimization program which includes automated exposure control, adjustment of the mA and/or kV according to patient size and/or use of iterative reconstruction technique. CONTRAST:  57mL OMNIPAQUE IOHEXOL 350 MG/ML SOLN COMPARISON:  None Available. FINDINGS: Cardiovascular: No filling defects in the pulmonary arteries to suggest pulmonary emboli. Heart is normal size. Aorta is normal caliber. Mediastinum/Nodes: No mediastinal, hilar, or axillary adenopathy. Trachea and esophagus are unremarkable. Thyroid unremarkable. Lungs/Pleura: Lungs are clear. No focal airspace opacities or suspicious nodules. No effusions. Upper Abdomen: No acute findings Musculoskeletal: Chest wall soft tissues are unremarkable. No acute bony abnormality. Review of the MIP images confirms the above findings. IMPRESSION: No evidence of pulmonary embolus. No acute cardiopulmonary disease. Electronically Signed   By: Rolm Baptise M.D.   On: 06/21/2022 02:47   DG Chest 2 View  Result Date: 06/20/2022 CLINICAL DATA:  Right-sided rib pain. EXAM: CHEST - 2 VIEW COMPARISON:  None Available. FINDINGS: The heart size and mediastinal contours are within normal limits. Both lungs are clear. The visualized skeletal structures are unremarkable. IMPRESSION: No active cardiopulmonary disease. Electronically Signed   By: Fidela Salisbury M.D.   On: 06/20/2022 20:41     Procedures Procedures    Medications Ordered in ED Medications  ketorolac (TORADOL) 30 MG/ML injection 30 mg (30 mg Intramuscular Given 06/21/22 0111)  iohexol (OMNIPAQUE) 350 MG/ML injection 75 mL (75 mLs Intravenous Contrast Given 06/21/22 0240)    ED Course/ Medical Decision Making/ A&P                             Medical Decision Making Amount and/or Complexity of Data Reviewed Labs: ordered. Radiology: ordered.  Risk Prescription drug management.   This patient presents to the ED for concern of chest pain, this involves an extensive number of treatment options, and is a complaint that carries with it a high risk of complications and morbidity.  I considered the following differential and admission for this acute, potentially life threatening condition.  The differential diagnosis includes ACS, PE, pneumothorax, pneumonia, chest wall  pain  MDM:    This is a 22 year old female who presents with right lower chest pain.  She is nontoxic and vital signs are reassuring.  She has some reproducible pain on exam.  EKG shows no evidence of acute ischemia or arrhythmia.  Chest x-ray without pneumothorax pneumonia.  Basic lab work is reassuring.  D-dimer was slightly elevated.  CT PE study obtained and shows no evidence of PE or other intrathoracic abnormality.  Patient was given Toradol.  Highly suspect musculoskeletal etiology.  She has no right upper quadrant pain and is status postcholecystectomy.  Have much lower suspicion for intra-abdominal pathology.  Will trial NSAIDs.  (Labs, imaging, consults)  Labs: I Ordered, and personally interpreted labs.  The pertinent results include: CBC, BMP, D-dimer  Imaging Studies ordered: I ordered imaging studies including chest x-ray, CT angio chest I independently visualized and interpreted imaging. I agree with the radiologist interpretation  Additional history obtained from chart review.  External records from outside source obtained and  reviewed including evaluations  Cardiac Monitoring: The patient was not maintained on a cardiac monitor.  If on the cardiac monitor, I personally viewed and interpreted the cardiac monitored which showed an underlying rhythm of: N/A  Reevaluation: After the interventions noted above, I reevaluated the patient and found that they have :improved  Social Determinants of Health: Live independently  Disposition:  d/c  Co morbidities that complicate the patient evaluation  Past Medical History:  Diagnosis Date   Anemia      Medicines Meds ordered this encounter  Medications   ketorolac (TORADOL) 30 MG/ML injection 30 mg   iohexol (OMNIPAQUE) 350 MG/ML injection 75 mL   naproxen (NAPROSYN) 500 MG tablet    Sig: Take 1 tablet (500 mg total) by mouth 2 (two) times daily.    Dispense:  30 tablet    Refill:  0    I have reviewed the patients home medicines and have made adjustments as needed  Problem List / ED Course: Problem List Items Addressed This Visit   None Visit Diagnoses     Chest wall pain    -  Primary                   Final Clinical Impression(s) / ED Diagnoses Final diagnoses:  Chest wall pain    Rx / DC Orders ED Discharge Orders          Ordered    naproxen (NAPROSYN) 500 MG tablet  2 times daily        06/21/22 0317              Merryl Hacker, MD 06/21/22 [phone]

## 2022-06-21 NOTE — Discharge Instructions (Addendum)
You were seen today for right lower chest pain.  Your workup is reassuring.  You do not have any evidence of blood clots.  Take naproxen twice daily to see if this helps.

## 2022-06-23 ENCOUNTER — Ambulatory Visit
Admit: 2022-06-23 | Discharge: 2022-06-24 | Payer: PRIVATE HEALTH INSURANCE | Attending: Student in an Organized Health Care Education/Training Program | Primary: Student in an Organized Health Care Education/Training Program

## 2022-06-23 DIAGNOSIS — M0579 Rheumatoid arthritis with rheumatoid factor of multiple sites without organ or systems involvement: Principal | ICD-10-CM

## 2022-06-23 MED ORDER — ENBREL SURECLICK 50 MG/ML (1 ML) SUBCUTANEOUS PEN INJECTOR
SUBCUTANEOUS | 11 refills | 28 days | Status: CP
Start: 2022-06-23 — End: ?
  Filled 2022-07-11: qty 4, 28d supply, fill #0

## 2022-06-23 MED ORDER — CYCLOBENZAPRINE 5 MG TABLET
ORAL_TABLET | Freq: Three times a day (TID) | ORAL | 0 refills | 20 days | Status: CP | PRN
Start: 2022-06-23 — End: ?

## 2022-06-23 NOTE — Unmapped (Signed)
[facility] Specialty Medication Onboarding    Specialty Medication: EnbreL SureClick 50 mg/mL (1 mL) Pnij (etanercept)  Prior Authorization: Approved   Financial Assistance: No - copay  <$25  Final Copay/Day Supply: $4 / 28 days    Insurance Restrictions: None     Notes to Pharmacist: n/a    The triage team has completed the benefits investigation and has determined that the patient is able to fill this medication at [facility]. Please contact the patient to complete the onboarding or follow up with the prescribing physician as needed.

## 2022-06-23 NOTE — Unmapped (Signed)
Rheumatology Clinic Follow-up Note     Assessment/Plan:    Alicia Norris is a 22 y.o.  female with a PMH of seropositive RA who presents today for follow up of RA.    Initially started on MTX with improvement but remained active despite 20mg  PO. Ultimately started on Enbrel 09/2021, she self-d/c'd MTX. Currently on Enbrel monotherapy.    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 Will continue Enbrel monotherapy.  - Enbrel qweekly  - OFF pred and MTX  - Reviewed 2/6 CBC w/ diff and CMP (ED) - mild microcytic anemia but otherwise reassuring    Swollen Joint Count (0-28): 0  Tender Joint Count (0-28): 0  Patient Global Assessment of Disease Activity (0-10): 0  Evaluator Global Assessment of Disease (0-10): 0  CDAI Score: 0  CDAI interpretation:  0.0-2.8 Remission   2.9-10.0 Low disease activity   10.1-22.0 Moderate disease activity   22.1-76.0 High disease activity         RUQ Pain: Not costochondritis and unrelated to her RA. Went to ED x2 without identifiable cause. Given focality of TTP, possible a muscle strain, will trial a brief course of muscle relaxer. Counseled to present to [facility] urgent care/ED if worsening.  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 She was admitted, per heme note yesterday it seems anticoagulation was deferred (limited data so had a shared decision). There is some doubt the pain is related to the thrombosis.  - Flexeril PRN    Return in about 6 months (around 12/22/2022).    [name], MD   Assistant Professor of Medicine  Department of Medicine/Division of Rheumatology  [facility]    Primary Care Provider: [facility]    HPI:  Alicia Norris is a 22 y.o.  female with a PMH of seropositive RA who presents today for follow up of RA.    Today  Doing very well from a joint standpoint. Feels the enbrel is working quite well and she is tolerating it without adverse effect. Did go to the ED x2 recently with RUQ abd pain. Seems to be sharp and worse with deep breaths or palpation. Had CT without a PE but no identifiable cause was found. Was told it was her RA and she needed to be seen here.    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 Re-presented 01/2021 and was [redacted]wks pregnant, was given pred monotherapy again. Started SSZ 02/2021, took for a few months but presented to [facility] ED on 05/2021 with flare, first saw [facility] 06/10/21 and started on MTX at that time. On MTX 20mg  PO she had significant improvement but remained active, Enbrel added ~09/2021.    Review of Systems:  Positive findings noted above, otherwise a 14 point review of systems was reviewed and negative    Past Medical, Surgical, Family and Social History reviewed and updated per EMR     Allergies:  Patient has no known allergies.    Medications:   No current facility-administered medications for this visit.    Current Outpatient Medications:     famotidine (PEPCID) 20 MG tablet, Take 1 tablet (20 mg total) by mouth two (2) times a day., Disp: 60 tablet, Rfl: 0    Facility-Administered Medications Ordered in Other Visits:  acetaminophen (TYLENOL) tablet 1,000 mg, 1,000 mg, Oral, Q8H, Guidici, Salena Saner, MD, 1,000 mg at 06/27/22 0346    aluminum-magnesium hydroxide-simethicone (MAALOX MAX) 80-80-8 mg/mL oral suspension, 30 mL, Oral, Q4H PRN, Guidici, Salena Saner, MD    amoxicillin (AMOXIL) capsule 500 mg, 500 mg, Oral, Q8H SCH, Stepanek, Jettie Booze, ANP    magnesium hydroxide (MILK OF MAGNESIA) oral suspension, 30 mL, Oral, BID PRN, 30 mL at 06/26/22 1930 **OR** bisacodyl (DULCOLAX) suppository 10 mg, 10 mg, Rectal, BID PRN, Suanne Marker, MD    enoxaparin (LOVENOX) syringe 40 mg, 40 mg, Subcutaneous, Q24H, Guidici, Salena Saner, MD, 40 mg at 06/26/22 1931    ferrous sulfate tablet 325 mg, 325 mg, Oral, Daily, Guidici, Salena Saner, MD, 325 mg at 06/27/22 1610    ketorolac (TORADOL) injection 30 mg, 30 mg, Intravenous, Q6H, Guidici, Salena Saner, MD, 30 mg at 06/27/22 0838    lidocaine 4 % patch 1 patch, 1 patch, Transdermal, Daily, Guidici, Salena Saner, MD, 1 patch at 06/27/22 0838    melatonin tablet 3 mg, 3 mg, Oral, Nightly PRN, Guidici, Salena Saner, MD    ondansetron [facility]) injection 4 mg, 4 mg, Intravenous, Q8H PRN, Elesa Massed, ANP, 4 mg at 06/27/22 1020    ondansetron (ZOFRAN-ODT) disintegrating tablet 4 mg, 4 mg, Oral, Q8H PRN, Elesa Massed, ANP, 4 mg at 06/27/22 9604    pantoprazole (Protonix) EC tablet 40 mg, 40 mg, Oral, Daily, Elesa Massed, ANP, 40 mg at 06/27/22 5409    polyethylene glycol (MIRALAX) packet 17 g, 17 g, Oral, BID, Stepanek, Kelly Diane, ANP, 17 g at 06/27/22 1020    predniSONE (DELTASONE) tablet 5 mg, 5 mg, Oral, Daily, Stepanek, Jettie Booze, ANP    promethazine (PHENERGAN) tablet 12.5 mg, 12.5 mg, Oral, Q6H PRN, Stepanek, Jettie Booze, ANP    senna (SENOKOT) tablet 2 tablet, 2 tablet, Oral, Nightly, Stepanek, Kelly Diane, ANP    SMOG ENEMA, 240 mL, Rectal, Once PRN, Stepanek, Jettie Booze, ANP    sucralfate (CARAFATE) oral suspension, 1 g, Oral, Q6H SCH, Stepanek, Kelly Diane, ANP, 1 g at 06/27/22 0558    traMADol (ULTRAM) tablet 50 mg, 50 mg, Oral, Q6H PRN, Guidici, Salena Saner, MD, 50 mg at 06/27/22 1005      Objective   Vitals:    06/23/22 1141   BP: 122/83   BP Site: L Arm   BP Position: Sitting   BP Cuff Size: Medium   Pulse: 99   Temp: 36.1 ??C (97 ??F)   TempSrc: Temporal   Weight: 86.6 kg (191 lb)   Height: 152.4 cm (5')           Physical Exam  General: well appearing, no acute distress  Eyes: EOMI, normal conjunctivae   ENT: MMM.  Oropharynx without any erythema or exudate.  No oral or nasal ulcers.  Neck: supple. No cervical lymphadenopathy  Cardiovascular: Regular rate and rhythm. No murmurs, rubs or gallops.   Pulmonary: Clear to auscultation bilaterally. Normal work of breathing.  Skin: No rash, lesions, breakdown. No purpura or petechiae. No digital ulcers.   Extremities: Warm and well perfused, no cyanosis, clubbing or edema  Musculoskeletal: No synovitis or tenderness to palpation in the hands, wrists, elbows, shoulders, knees, ankles, or feet. Full ROM throughout.   Neurologic: Cranial nerves grossly intact, strength 5/5 throughout.  Normal sensation  Psychiatric: Normal mood and affect.    I personally spent 37 minutes face-to-face and non-face-to-face in the care of this patient,  which includes all pre, intra, and post visit time on the date of service.  All documented time was specific to the E/M visit and does not include any procedures that may have been performed.

## 2022-06-25 ENCOUNTER — Ambulatory Visit
Admit: 2022-06-25 | Discharge: 2022-06-28 | Disposition: A | Discharge status: Home / Self Care | Payer: PRIVATE HEALTH INSURANCE

## 2022-06-25 ENCOUNTER — Ambulatory Visit: Admit: 2022-06-25 | Discharge: 2022-06-28 | Payer: PRIVATE HEALTH INSURANCE

## 2022-06-25 ENCOUNTER — Encounter: Admit: 2022-06-25 | Discharge: 2022-06-28 | Payer: PRIVATE HEALTH INSURANCE

## 2022-06-25 LAB — CBC W/ AUTO DIFF
BASOPHILS ABSOLUTE COUNT: 0.1 10*9/L (ref 0.0–0.1)
BASOPHILS RELATIVE PERCENT: 1.2 %
EOSINOPHILS ABSOLUTE COUNT: 0.2 10*9/L (ref 0.0–0.5)
EOSINOPHILS RELATIVE PERCENT: 2.3 %
HEMATOCRIT: 35.5 % (ref 34.0–44.0)
HEMOGLOBIN: 12 g/dL (ref 11.3–14.9)
LYMPHOCYTES ABSOLUTE COUNT: 2.8 10*9/L (ref 1.1–3.6)
LYMPHOCYTES RELATIVE PERCENT: 32.9 %
MEAN CORPUSCULAR HEMOGLOBIN CONC: 33.7 g/dL (ref 32.0–36.0)
MEAN CORPUSCULAR HEMOGLOBIN: 25.8 pg — ABNORMAL LOW (ref 25.9–32.4)
MEAN CORPUSCULAR VOLUME: 76.6 fL — ABNORMAL LOW (ref 77.6–95.7)
MEAN PLATELET VOLUME: 7.4 fL (ref 6.8–10.7)
MONOCYTES ABSOLUTE COUNT: 0.5 10*9/L (ref 0.3–0.8)
MONOCYTES RELATIVE PERCENT: 5.4 %
NEUTROPHILS ABSOLUTE COUNT: 4.9 10*9/L (ref 1.8–7.8)
NEUTROPHILS RELATIVE PERCENT: 58.2 %
PLATELET COUNT: 394 10*9/L (ref 150–450)
RED BLOOD CELL COUNT: 4.64 10*12/L (ref 3.95–5.13)
RED CELL DISTRIBUTION WIDTH: 15.9 % — ABNORMAL HIGH (ref 12.2–15.2)
WBC ADJUSTED: 8.5 10*9/L (ref 3.6–11.2)

## 2022-06-25 LAB — URINALYSIS WITH MICROSCOPY WITH CULTURE REFLEX
BILIRUBIN UA: NEGATIVE
GLUCOSE UA: NEGATIVE
KETONES UA: NEGATIVE
NITRITE UA: NEGATIVE
PH UA: 6.5 (ref 5.0–9.0)
PROTEIN UA: NEGATIVE
RBC UA: 3 /HPF (ref <=4)
SPECIFIC GRAVITY UA: 1.021 (ref 1.003–1.030)
SQUAMOUS EPITHELIAL: 21 /HPF — ABNORMAL HIGH (ref 0–5)
UROBILINOGEN UA: <2
WBC UA: 17 /HPF — ABNORMAL HIGH (ref 0–5)

## 2022-06-25 LAB — HCG QUANTITATIVE, BLOOD: GONADOTROPIN, CHORIONIC (HCG) QUANT: <2.6 m[IU]/mL

## 2022-06-25 LAB — COMPREHENSIVE METABOLIC PANEL
ALBUMIN: 3.4 g/dL (ref 3.4–5.0)
ALKALINE PHOSPHATASE: 77 U/L (ref 46–116)
ALT (SGPT): 24 U/L (ref 10–49)
ANION GAP: 5 mmol/L (ref 5–14)
AST (SGOT): 20 U/L (ref <=34)
BILIRUBIN TOTAL: 0.3 mg/dL (ref 0.3–1.2)
BLOOD UREA NITROGEN: 12 mg/dL (ref 9–23)
BUN / CREAT RATIO: 21
CALCIUM: 8.7 mg/dL (ref 8.7–10.4)
CHLORIDE: 108 mmol/L — ABNORMAL HIGH (ref 98–107)
CO2: 27 mmol/L (ref 20.0–31.0)
CREATININE: 0.58 mg/dL
EGFR CKD-EPI (2021) FEMALE: >90 mL/min/{1.73_m2} (ref >=60)
GLUCOSE RANDOM: 92 mg/dL (ref 70–179)
POTASSIUM: 3.5 mmol/L (ref 3.4–4.8)
PROTEIN TOTAL: 6.7 g/dL (ref 5.7–8.2)
SODIUM: 140 mmol/L (ref 135–145)

## 2022-06-25 LAB — LIPASE: LIPASE: 31 U/L (ref 12–53)

## 2022-06-25 MED ORDER — FAMOTIDINE 20 MG TABLET
ORAL_TABLET | Freq: Two times a day (BID) | ORAL | 0 refills | 30 days | Status: CP
Start: 2022-06-25 — End: 2022-07-25

## 2022-06-25 MED ADMIN — acetaminophen (TYLENOL) tablet 1,000 mg: 1000 mg | ORAL | @ 18:00:00 | Stop: 2022-06-25

## 2022-06-25 MED ADMIN — iohexol (OMNIPAQUE) 350 mg iodine/mL solution 100 mL: 100 mL | INTRAVENOUS | @ 21:00:00 | Stop: 2022-06-25

## 2022-06-25 MED ADMIN — sucralfate (CARAFATE) oral suspension: 1 g | ORAL | @ 18:00:00 | Stop: 2022-06-25

## 2022-06-25 MED ADMIN — famotidine (PF) (PEPCID) injection 40 mg: 40 mg | INTRAVENOUS | @ 18:00:00 | Stop: 2022-06-25

## 2022-06-25 MED ADMIN — aluminum-magnesium hydroxide-simethicone (MAALOX MAX) 80-80-8 mg/mL oral suspension: 30 mL | ORAL | @ 18:00:00 | Stop: 2022-06-25

## 2022-06-25 NOTE — Unmapped (Signed)
[facility]  Emergency Department Provider Note     ED Clinical Impression     Final diagnoses:   Right upper quadrant abdominal pain (Primary)      Impression, Medical Decision Making, ED Course     Impression: 22 y.o. female who has a past medical history of rheumatoid arthritis who presents with 2 weeks of worsening RUQ abdominal pain localized under her right ribs that radiates to her right flank as described below.     Upon evaluation in ED: Patient laying comfortably in bed.  Patient does not appear to be in acute distress.  Patient's cardiopulmonary exam is unremarkable.  Patient's abdominal exam with tenderness to the right upper quadrant, radiation into the back, distractible with ultrasound.  No distention, no rebound.    DDx/MDM: Gastritis, gastroenteritis, ascending cholangitis, peptic ulcer disease, among other etiologies.  Patient recently had CT at outside hospital of chest.  Shared decision making practice, will get labs and reassess.  Providing patient with GI cocktail.    After GI cocktail, patient still endorsing marked right upper quadrant pain.  Patient will be signed out to oncoming provider pending imaging.    Diagnostic workup as below. Will treat patient with GI cocktail, Pepcid.    Orders Placed This Encounter   Procedures    Urine Culture    ED POCUS Right Upper Quadrant    CT Abdomen Pelvis W IV Contrast Only    CBC w/ Differential    Comprehensive Metabolic Panel    Lipase    hCG, Quantitative, Pregnancy    Urinalysis with Microscopy with Culture Reflex    Insert peripheral IV       ED Course as of 06/25/22 1556   Sun Jun 25, 2022   1301 Patient lab work reassuring.  Reevaluated patient, continues to have right upper quadrant pain after GI cocktail.  Patient is requesting imaging at this time.   1302 CT abdomen pelvis placed.   1555 Patient to be signed out to oncoming provider, pending CT abdomen pelvis with contrast.  Patient came in with right upper quadrant pain, was seen outside hospital and got a CT chest to rule out PE which did not show any acute findings.  Patient states her right upper quadrant pain got worse over the last several days.  Dispo pending imaging, if negative, patient to be discharged and follow-up with PCP.       MDM Elements  Discussion of Management with other Physicians, QHP or Appropriate Source: Pending imaging  Independent Interpretation of Studies: CT scan(s) - pending  I have reviewed recent and relevant previous record, including: External ED note - [facility] ED note from 06/20/22 and [facility] note from 06/22/22 for PMH and details regarding recent visit for similar symptoms.  Social Determinants that significantly affected care: N/A    ____________________________________________    The case was discussed with the attending physician, who is in agreement with the above assessment and plan.      History     Chief Complaint  Chief Complaint   Patient presents with    Abdominal Pain       HPI   Alicia Norris is a 22 y.o. female with past medical history as below who presents with 2 weeks of worsening RUQ abdominal pain localized under her right ribs that radiates to her right flank with associated nausea. She describes her abdominal pain as a constant dull ache that worsens with movement but is unchanged after  eating or with defecation. She last had a BM last night that was normal in appearance with no hematochezia or melena. Of note, she presented to [facility] ED on 06/20/22 for her right lower rib pain where her work up including CTA chest was reassuring with no evidence of PE or acute cardiopulmonary disease. She reports intermittent constipation and diarrhea at baseline. No recent increase in spicy foods, coffee or soda. She take prednisone once or twice a month and enbrel weekly for management of her RA. She denies dysuria, urinary frequency or hematuria. She has an abdominal surgical history of cholecystectomy in 2019. She endorses pyelonephritis in 2019 but denies a history of kidney stones. Further denies a history of gastric ulcers.    Outside Historian(s): I have obtained additional history/collateral from patient's friend at bedside.    No past medical history on file.    No past surgical history on file.    No current facility-administered medications for this encounter.    Current Outpatient Medications:     acetaminophen (TYLENOL) 325 MG tablet, Take 2 tablets (650 mg total) by mouth. (Patient not taking: Reported on 06/23/2022), Disp: , Rfl:     cyclobenzaprine (FLEXERIL) 5 MG tablet, Take 1 tablet (5 mg total) by mouth Three (3) times a day as needed for muscle spasms., Disp: 60 tablet, Rfl: 0    empty container Misc, USE AS DIRECTED (Patient not taking: Reported on 06/23/2022), Disp: 1 each, Rfl: 2    ENBREL SURECLICK 50 MG/ML (0.98 ML) SUBCUTANEOUS PEN, Inject the contents of 1 pen (50 mg total) under the skin every seven (7) days., Disp: 4 mL, Rfl: 11    famotidine (PEPCID) 20 MG tablet, Take 1 tablet (20 mg total) by mouth two (2) times a day., Disp: 60 tablet, Rfl: 0    ferrous sulfate 325 (65 FE) MG EC tablet, Take 1 tablet (325 mg total) by mouth daily. (Patient not taking: Reported on 12/21/2021), Disp: , Rfl:     predniSONE (DELTASONE) 5 MG tablet, TAKE 1 TABLET (5 MG TOTAL) BY MOUTH DAILY., Disp: 30 tablet, Rfl: 5    Allergies  Patient has no known allergies.    Family History  No family history on file.    Social History  Social History     Tobacco Use    Smoking status: Never     Passive exposure: Never    Smokeless tobacco: Never        Physical Exam     VITAL SIGNS:      Vitals:    06/25/22 1130 06/25/22 1133   BP:  121/76   Pulse: 82 93   Resp:  18   Temp:  36.5 ??C (97.7 ??F)   SpO2: 96% 100%   Weight:  86.6 kg (191 lb)   Height:  154.9 cm (5' 1)       Constitutional: Alert and oriented. No acute distress.  Eyes: Conjunctivae are normal.  HEENT: Normocephalic and atraumatic. Conjunctivae clear. No congestion. Moist mucous membranes.   Cardiovascular: Rate as above, regular rhythm. Normal and symmetric distal pulses. Brisk capillary refill. Normal skin turgor.  Respiratory: Normal respiratory effort. Breath sounds are normal. There are no wheezing or crackles heard.  Gastrointestinal: Soft, non-distended, mildly tender diffusely, distractible with ultrasound  Genitourinary: Deferred.  Musculoskeletal: Non-tender with normal range of motion in all extremities.  Neurologic: Normal speech and language. No gross focal neurologic deficits are appreciated. Patient is moving all extremities equally, face is symmetric at rest  and with speech.  Skin: Skin is warm, dry and intact. No rash noted.  Psychiatric: Mood and affect are normal. Speech and behavior are normal.     Radiology     ED POCUS Right Upper Quadrant   Final Result      CT Abdomen Pelvis W IV Contrast Only    (Results Pending)       Pertinent labs & imaging results that were available during my care of the patient were independently interpreted by me and considered in my medical decision making (see chart for details).    Portions of this record have been created using Scientist, clinical (histocompatibility and immunogenetics). Dictation errors have been sought, but may not have been identified and corrected.    Documentation assistance was provided by Desma Paganini, Scribe, on June 25, 2022 at 11:52 AM for Melvern Sample, MD.      A scribe was used when documenting this visit. I agree with the above documentation. Signed by  Leslye Peer, MD on  June 25, 2022 at 12:38 PM            Leslye Peer, MD  Resident  06/25/22 1556

## 2022-06-25 NOTE — Unmapped (Signed)
Pt here for RUQ abd pain x2 weeks, denies N/V.

## 2022-06-26 LAB — D-DIMER, QUANTITATIVE: D-DIMER QUANTITATIVE (CW,ML,HL,HS,CH,JS,JC,RX,RH): 491 ng{FEU}/mL (ref <=500)

## 2022-06-26 LAB — C-REACTIVE PROTEIN: C-REACTIVE PROTEIN: <4 mg/L (ref <=10.0)

## 2022-06-26 LAB — SEDIMENTATION RATE: ERYTHROCYTE SEDIMENTATION RATE: 19 mm/h (ref 0–20)

## 2022-06-26 MED ADMIN — ketorolac (TORADOL) injection 30 mg: 30 mg | INTRAVENOUS | @ 20:00:00 | Stop: 2022-06-30

## 2022-06-26 MED ADMIN — ketorolac (TORADOL) injection 30 mg: 30 mg | INTRAVENOUS | @ 08:00:00 | Stop: 2022-06-30

## 2022-06-26 MED ADMIN — lidocaine 4 % patch 1 patch: 1 | TRANSDERMAL | @ 14:00:00

## 2022-06-26 MED ADMIN — enoxaparin (LOVENOX) syringe 40 mg: 40 mg | SUBCUTANEOUS | @ 02:00:00

## 2022-06-26 MED ADMIN — ketorolac (TORADOL) injection 30 mg: 30 mg | INTRAVENOUS | @ 02:00:00 | Stop: 2022-06-30

## 2022-06-26 MED ADMIN — lactulose oral solution: 20 g | ORAL | @ 20:00:00 | Stop: 2022-06-26

## 2022-06-26 MED ADMIN — sucralfate (CARAFATE) oral suspension: 1 g | ORAL | @ 18:00:00

## 2022-06-26 MED ADMIN — ferrous sulfate tablet 325 mg: 325 mg | ORAL | @ 14:00:00

## 2022-06-26 MED ADMIN — acetaminophen (TYLENOL) tablet 1,000 mg: 1000 mg | ORAL | @ 02:00:00

## 2022-06-26 MED ADMIN — lidocaine 4 % patch 1 patch: 1 | TRANSDERMAL | @ 02:00:00

## 2022-06-26 MED ADMIN — acetaminophen (TYLENOL) tablet 1,000 mg: 1000 mg | ORAL | @ 08:00:00

## 2022-06-26 MED ADMIN — sodium chloride 0.9% (NS) bolus 500 mL: 500 mL | INTRAVENOUS | @ 16:00:00 | Stop: 2022-06-26

## 2022-06-26 MED ADMIN — traMADol (ULTRAM) tablet 50 mg: 50 mg | ORAL | @ 16:00:00

## 2022-06-26 MED ADMIN — acetaminophen (TYLENOL) tablet 1,000 mg: 1000 mg | ORAL | @ 18:00:00

## 2022-06-26 MED ADMIN — pantoprazole (Protonix) EC tablet 40 mg: 40 mg | ORAL | @ 18:00:00

## 2022-06-26 MED ADMIN — ferrous sulfate tablet 325 mg: 325 mg | ORAL | @ 02:00:00

## 2022-06-26 MED ADMIN — ketorolac (TORADOL) injection 30 mg: 30 mg | INTRAVENOUS | @ 14:00:00 | Stop: 2022-06-30

## 2022-06-26 NOTE — Unmapped (Signed)
Received sign out from previous provider.    Patient Summary: Alicia Norris is a 22 y.o. female with a past medical history of rheumatoid arthritis who presents for 2 weeks of worsening RUQ abdominal pain radiating to her right flank with associated nausea. She was previously seen at an outside ED on 06/20/2022 for her symptoms with a negative CTA Chest and no cardiopulmonary concerns at the time, however her symptoms have not improved. Physical exam revealed RUQ tenderness to palpation.  Action List:   Awaiting OBGYN/ Hem Onc recommendations.     Updates  ED Course as of 06/25/22 2036   Wynelle Link Jun 25, 2022   1746 Received patient at signout from previous resident.  At the time of signout, patient had a CT abdomen and pelvis which showed a right sided gonadal vein thrombosis.  Discussed this with radiology over the phone who states that they are unclear of what management might be appropriate for this or services may manage this.  I am unfamiliar with this condition as well although documented previously that the patient has never been pregnant and is not recently postpartum, she has had 3 prior pregnancies.  We will reach out to both hematology oncology and gynecology for further insight.   1848 Following discussions with both gynecology and hematology oncology, it appears that the most appropriate treatment for this would be to start the patient on anticoagulation.  Heparin or therapeutic Lovenox was recommended, will start heparin.  I discussed with the patient her workup here, she has been discharged several times from the hospital and feels uncomfortable starting anticoagulation at home.  Will start the patient on heparin here and page out for admission for symptomatic management and initiation of anticoagulation.   2028 After the hospitalist team evaluated the patient here, they would like to hold off on starting her on heparin.  They will admit the patient otherwise.       Documentation assistance was provided by Nadene Rubins, Scribe, on June 25, 2022 at 5:48 PM for Nat Christen, MD.    Documentation assistance was provided by the scribe in my presence.  The documentation recorded by the scribe has been reviewed by me and accurately reflects the services I personally performed.     Note has been documented by Nadene Rubins on 06/25/2022

## 2022-06-26 NOTE — Unmapped (Signed)
Discussed case with ED Resident Dr. Teresa Pelton. 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 Not postpartum, no prior pregnancy.    Recommended discussion with heme. Based off literature review, OVT can be treated if symptomatic, septic, or simultaneous DVT/PE, but treatment with anticoagulation does not seem to change outcomes. However, due to patient's RUQ pain prompting ED evaluation, can consider this as symptomatic and consider treatment with anticoagulation. Pt is afebrile, normal BP and HR, and no leukocytosis, so no S&S sepsis and would not recommend antibiotics at this time due to no c/f septic thrombophlebitis. Defer final decision to hematology but agree with recommendation for anticoagulation and recommend outpatient hematology follow-up for duration of AC.    No acute GYN needs or intervention at this time.      Discussed with Dr. Charletta Cousin and Dr. Teresa Pelton.    Alfonzo Beers, MD  PGY3 OBGYN

## 2022-06-26 NOTE — Unmapped (Addendum)
[facility] Medicine   History and Physical       Assessment and Plan     Alicia Norris is a 22 y.o. female who is presenting to [facility] with Thrombosis, in the setting of the following pertinent/contributing co-morbidities: seropositive rheumatoid arthritis, anemia .      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 Given history of RUQ, worse with movement, this seems like an unrelated finding. Patient also is not convinced that her pain is related to this thrombosis. She has a history of 3 prior pregnancies, last resulted in birth in Oct 2022. Currently not pregnant and Paragard IUD in place. 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 No signs of sepsis at this time. I personally spoke with hematology, who felt workup for hypercoagulability (inpatient vs outpatient) would be appropriate and recommended anticoagulation given abdominal pain. OB GYN was consulted by ED and left treatment plan note indicating that [facility] typically does not change outcomes outside of the setting of symptoms related to thrombosis, septic thrombosis, or simultaneous DVT/PE. CT chest at OSH without PE. Patient denies any recent long travel, immobility, or LE pain/swelling.  - patient prefers to hold off on anticoagulation until she can discuss further whether this is needed/appropriate with Heme in the AM  - check CRP, ESR, d dimer  - consider STD testing or LE dopplers if appropriate per heme +/-  OBGYN      Secondary/Additional Active Problems:      RUQ pain- TTP and worse with movement. She denies any significant event prior to pain starting that would be indicative of strain injury of her back. She does have 3 young children. Has not improved with relatively low dose anti-inflammatory treatments or heat at home. No improvement with GI cocktail. No indication that PO intake worsens/improves pain, which makes GI pathology seem less likely. Hx cholecystectomy in the past.  - tylenol 1000 q8hr  - toradol q6hr  - lidocaine patches   - flexeril prn     Seropositive RA  - patient on enbrel, follows with [facility] at [facility]  - check CRP, ESR    Anemia  Currently normal hgb      Prophylaxis  -lovenox VTE ppx dose for now given hx VTE and obesity (padua score 4)    Diet  -regular    Code Status / HCDM   - FULL, Unverified but presumed, based on previous history   -    Significant Comorbid Conditions:      Issues Impacting Complexity of Management:      Medical Decision Making: Reviewed records from the following unique sources  [facility] ED, [facility] ED notes. Discussed the patient's management and/or test interpretation with  hematology  as summarized within this note    I personally spent greater than 90 minutes face-to-face and non-face-to-face in the care of this patient, which includes all pre, intra, and post visit time on the date of service.  All documented time was specific to the E/M visit and does not include any procedures that may have been performed.    HPI      Alicia Norris is a 22 y.o. female who is presenting to [facility] with Thrombosis.    States that about 2 weeks ago, started with RUQ pain. Initially felt like a bruise over her lowest R rib. She states that the pain was mostly  constant. About 3 days later, started to have pinching or stabbing like pain in the same area and she became more concerned. About 3 days later, started having exacerbation of the pain when she would raise her R arm- pain would extend to her upper back and side. A few days later, she states she couldn't sleep normally because lying with her entire body extended was causing more pain, so she began sleeping in a chair. She has been taking either tylenol 500 mg or advil 400 mg about two times a day since the pain started without any improvement. She states that she also tried taking prednisone 5 mg without relief (which she has on hand for her RA flares). She tried heating pads, warm showers, nothing helped. She noticed RUQ bloating twice during this entire episode. She denies fevers, chills, vomiting, constipation. She does have some nausea associated with the discomfort. She does not feel this is similar to the pain she had prior to cholecystectomy in the past. Pain is worse with moving and with laughing. Has not been able to pick up her children (ages 44, 49, 1-1/2- born Oct 2022). She denies any chance of pregnancy and has a Paraguard IUD in place.    She presented to [facility] ED on Monday, where they performed CT chest, CXR which were negative for PE or other pathology. She was given toradol and advised to start nexium. States she did not start this because she didn't believe their clinical opinion. She was then seen On Thursday by [facility] ED, where they did not perform any more tests and gave her IM decadron for her history of RA, advised her to follow up with her rheumatologist. She saw her rheumatologist Tomasa Rand, [facility]) on 2/9, who recommended flexeril. She states he told her that the area she is experiencing pain should not cause any arthritis (no joints there). She has not started flexeril because     ER course was notable for normal vitals and temp. She was given pepcid and a GI cocktail without improvement in abdominal pain. She had CT abd- which noted thrombosis of R gonadal vein, no other acute pathology. UA abnormal, but not a clean catch.    I reviewed OB GYN treatment plan note. I spoke with hematology fellow.      Med Rec Confidence   I reviewed the Medication List. The current list is Accurate    Physical Exam   Temp:  [36.4 ??C (97.5 ??F)-36.5 ??C (97.7 ??F)] 36.4 ??C (97.5 ??F)  Heart Rate:  [82-93] 86  Resp:  [18] 18  BP: (121-122)/(76-83) 122/83  SpO2:  [96 %-100 %] 100 %  Body mass index is 36.09 kg/m??.  GEN: NAD, lying in bed, appears comfortable until abdomen is examined, including while changing position on stretcher  EYES: EOMI, sclera clear  ENT: MMM, normal appearing nose without discharge  CV: RRR, no murmurs appreciated  PULM: CTA B, normal WOB  ABD: soft, TTP in the RUQ under the ribs without rebound, not TTP at any other location, no guarding  EXT: No edema, WWP  NEURO: No focal deficits  PSYCH: A+Ox3, appropriate speech and behavior          ___________________________________________________________________    Medications     Prior to Admission medications    Medication Dose, Route, Frequency   acetaminophen (TYLENOL) 325 MG tablet 650 mg  Patient not taking: Reported on 06/23/2022   copper 380 square MM (PARAGARD T 380A) 380 square mm IUD intrauterine device  1 each, Intrauterine, Once   cyclobenzaprine (FLEXERIL) 5 MG tablet 5 mg, Oral, 3 times a day PRN   empty container Misc USE AS DIRECTED  Patient not taking: Reported on 06/23/2022   ENBREL SURECLICK 50 MG/ML (0.98 ML) SUBCUTANEOUS PEN Inject the contents of 1 pen (50 mg total) under the skin every seven (7) days.   famotidine (PEPCID) 20 MG tablet 20 mg, Oral, 2 times a day (standard)   ferrous sulfate 325 (65 FE) MG EC tablet 1 tablet, Oral, Daily (standard)   predniSONE (DELTASONE) 5 MG tablet 5 mg, Oral, Daily (standard), TAKE 1 TABLET (5 MG TOTAL) BY MOUTH DAILY.       Allergies   Patient has no known allergies.     Medical History     Past Medical History:   Diagnosis Date    Acquired deafness, right     Anemia     Meningitis 04/1999    Rheumatoid arthritis (CMS-HCC)        Social History   Tobacco use:   reports that she has never smoked. She has never been exposed to tobacco smoke. She has never used smokeless tobacco.  Alcohol use:   has no history on file for alcohol use.  Drug use:  reports that she does not currently use drugs.    Family History     Family History   Problem Relation Age of Onset    Asthma Mother     Diabetes Mother     Asthma Father     Asthma Sister     Asthma Sister     Asthma Brother        Surgical History   No past surgical history on file.

## 2022-06-26 NOTE — Unmapped (Signed)
VSS.   Pt afebrile . With no s/s of infection noted. No falls noted. Pt educated to call for assistance if needed..  . Pain  controlled by scheduled pain meds . No  prn pain meds requested. DVT protocol in place.pt on lovenox. . Will continue to monitor.    Problem: Adult Inpatient Plan of Care  Goal: Plan of Care Review  Outcome: Progressing  Goal: Patient-Specific Goal (Individualized)  Outcome: Progressing  Goal: Absence of Hospital-Acquired Illness or Injury  Outcome: Progressing  Intervention: Identify and Manage Fall Risk  Recent Flowsheet Documentation  Taken 06/25/2022 2232 by Darene Lamer, RN  Safety Interventions:   fall reduction program maintained   low bed   nonskid shoes/slippers when out of bed  Intervention: Prevent Skin Injury  Recent Flowsheet Documentation  Taken 06/25/2022 2232 by Darene Lamer, RN  Positioning for Skin: Supine/Back  Device Skin Pressure Protection: adhesive use limited  Skin Protection: adhesive use limited  Goal: Optimal Comfort and Wellbeing  Outcome: Progressing  Goal: Readiness for Transition of Care  Outcome: Progressing  Goal: Rounds/Family Conference  Outcome: Progressing     Problem: VTE (Venous Thromboembolism)  Goal: Tissue Perfusion  Outcome: Progressing  Goal: Right Ventricular Function  Outcome: Progressing     Problem: Pain Acute  Goal: Optimal Pain Control and Function  Outcome: Progressing

## 2022-06-26 NOTE — Unmapped (Signed)
Daily Progress Note    Assessment/Plan:    Principal Problem:    Thrombosis  Active Problems:    RUQ pain  Resolved Problems:    * No resolved hospital problems. *                 Alicia Norris is a 22 y.o. female who presented to [facility] with Thrombosis.    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 Given history of RUQ, worse with movement, this seems like an unrelated finding. Patient also is not convinced that her pain is related to this thrombosis. She has a history of 3 prior pregnancies, last resulted in birth in Oct 2022. Currently not pregnant and Paragard IUD in place. 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 No signs of sepsis at this time. Admitting provider spoke directly with  hematology, who felt workup for hypercoagulability (inpatient vs outpatient) would be appropriate and recommended anticoagulation given abdominal pain. OB GYN was consulted by ED and left treatment plan note indicating that [facility] typically does not change outcomes outside of the setting of symptoms related to thrombosis, septic thrombosis, or simultaneous DVT/PE. CT chest at OSH without PE. Patient denies any recent long travel, immobility, or LE pain/swelling. Discussed anticoagulation with pt, and patient prefers to hold off on anticoagulation until she can discuss further whether this is needed/appropriate with Heme in the AM. CRP, sed rate, D dimer all wnl.   - appreciate hematology consult, waiting on recs  - consider STD testing or LE dopplers if appropriate per heme +/-  OBGYN        Secondary/Additional Active Problems:      RUQ pain- TTP and worse with movement. She denies any significant event prior to pain starting that would be indicative of strain injury of her back. She does have 3 young children. Has not improved with relatively low dose anti-inflammatory treatments or heat at home. No improvement with GI cocktail. Initially did not think PO intake worsens/improves pain, but now feels pain worse with eating, and does get nauseated. Hx cholecystectomy in the past. KUB with moderate colonic stool burden  - tylenol 1000 q8hr  - toradol q6hr  - lidocaine patches   - flexeril prn  - start carafate QID  - start pantoprazole     Constipation: KUB with moderate colonic stool burden.   - lactulose x1  - smog enema PRN     Seropositive RA  - patient on enbrel, follows with [facility] at [facility]  - check CRP, ESR     Anemia  Currently normal hgb     ___________________________________________________________________    Subjective:  Pt continues to c/o abdominal pain, now with some dizziness, nausea. Pain does get worse after eating. KUB with moderate colonic stool burden, which could contribute to her abdominal pain.      Recent Results (from the past 24 hour(s))   D-Dimer, Quantitative    Collection Time: 06/26/22  3:48 AM   Result Value Ref Range    D-Dimer 491 <=500 ng/mL FEU   Sedimentation Rate    Collection Time: 06/26/22  3:48 AM   Result Value Ref Range    Sed Rate 19 0 - 20 mm/h   POCT Glucose    Collection Time: 06/26/22  9:29 AM   Result Value Ref Range    Glucose, POC 136 70 - 179 mg/dL     Labs/Studies:  Labs and Studies  from the last 24hrs per EMR and Reviewed    Objective:  Temp:  [36.4 ??C (97.5 ??F)-36.7 ??C (98.1 ??F)] 36.4 ??C (97.5 ??F)  Heart Rate:  [82-106] 82  Resp:  [18-20] 18  BP: (99-124)/(73-83) 105/73  SpO2:  [98 %-100 %] 99 %    GEN: alert, cooperative, initially sleeping soundly, woke to physical stimuli  HEENT: EOMI, Sclera anicteric, MMM  CARDS: RRR, no murmurs noted  PULM: CTA B, no wheezes or crackles, good air movement, no increased work of breathing on RA  PSYCH: cslm, cooperative  ABD: soft, TTP in the RUQ under the ribs without rebound, not TTP at any other location, no guarding   EXTREM: no LE edema  NEURO: grossly nonfocal, speech clear, moving all extremities equally

## 2022-06-26 NOTE — Unmapped (Signed)
[facility]  Emergency Department Progress Note    June 25, 2022 6:19 PM    I supervised care provided by the resident/APP. We have discussed the case, I have reviewed the note and I agree with the plan of treatment.      ED care from Dr. Tressie Ellis at 1700.     Medical Decision Making and Progress Notes     ED Course as of 06/25/22 1819   Alicia Norris Jun 25, 2022   3939 22 year old female who presented with right-sided abdominal pain signed out pending CT imaging.  CT shows a gonadal vein thrombosis.  Patient is not pregnant and has not recently postpartum.   1818 Case discussed with gynecology and hematology, will plan to initiate anticoagulation.  Patient will have difficulty coordinating follow-up and additional workup so we will plan for admission to facilitate expedited evaluation.         Additional Progress Notes and Critical Care    NA    Portions of this record have been created using Dragon dictation software. Dictation errors have been sought, but may not have been identified and corrected.      ED Clinical Impression     Final diagnoses:   Right upper quadrant abdominal pain (Primary)

## 2022-06-26 NOTE — Unmapped (Signed)
Bed: 79-D  Expected date:   Expected time:   Means of arrival:   Comments:  From RP Ryland Smoots

## 2022-06-26 NOTE — Unmapped (Signed)
Hematology Consult Note    Requesting Attending Physician :  Elesa Massed, ANP  Service Requesting Consult : Med Bernita Raisin [facility])  Reason for Consult: Right-sided gonadal vein thrombosis   Primary Hematologist: None    Assessment: Alicia Norris is a 22 y.o. female, with a medical history significant for seropositive RA, on Enbrel monotherapy who presented with RUQ abdominal pain, and the CTA A/P showed a right gonadal thrombosis.  Hematology was consulted for evaluation and management.     OBGYN treatment plan note at the time of admission: anticoagulation for gonadal thrombosis typically does not change outcomes outside of the setting of symptoms related to thrombosis, septic thrombosis, or simultaneous DVT/PE    We think that the patient's RUQ pain and the right gonadal thrombosis found are unrelated. The timing for the right gonadal thrombosis is known; her last delivery was back in October 2022, and she denies any current or prior history of lower abdominal or pelvic pain. She also denies recent travel or trauma. She denies OCPs. The right gonadal thrombosis may be an incidental finding. The etiology of her RUQ pain is known at this time. We explained to the patient that we agreed with the OBGYN team about the above-stated [facility] indications in the gonadal thrombosis. The data supporting AC in this setting is limited.     After an extensive conversation with the patient and a discussion of the risks and benefits of AC and the uncertainty of benefits in her case, the final shared decision was against anticoagulation.     Recommendations:   - Shared decision against anticoagulation treatment.  - We defer to the primary team further workup/management of the RUQ pain     Will sign off, but please contact the hematology fellow at [phone] with any further questions.    This patient has been seen and discussed with Dr. Harlow Ohms. These recommendations were discussed with the primary team.       Alicia Mestre B. Danelle Earthly, MD   Hematology-Oncology Fellow    -------------------------------------------------------------    HPI: Alicia Norris is a 22 y.o. female,  who is being seen at the request of Elesa Massed, ANP for evaluation of right gonadal vein thrombosis.     Alicia Norris has a history of seropositive RA, iniitially started on MTX with improvement but remained active despite 20mg  PO. Ultimately started on Enbrel 09/2021, she has self-d/c'd MTX. Currently on Enbrel monotherapy.     She presented to the ED on 02/11 reporting RUQ pain. The CT abdomen showed thrombosis of R gonadal vein. It appears to be unrelated to the RUQ pain.  OB GYN was consulted by ED and indicated that [facility] typically does not change outcomes outside of the setting of symptoms related to thrombosis, septic thrombosis, or simultaneous DVT/PE; however, due to patient's RUQ pain prompting ED evaluation, can consider this as symptomatic and consider treatment with anticoagulation.  Hematology, was contacted by the ED provider, and  workup for hypercoagulability (inpatient vs outpatient) was recommended, as well as anticoagulation given abdominal pain.     CTA chest at OSH on 06/21/2022 noted without PE.    She has a history of 3 prior pregnancies, spontaneously vaginal deliveries, last Oct 2022. Currently with Paragard IUD in place. No signs of sepsis at this time. Patient denies any recent long travel, immobility, or LE pain/swelling. Patient denies prior personal VTE and family VTE. Patient denies chest pain, tachycardia or SOB.      Review of  Systems: Review of Systems - Negative except as stated above.    Past medical, surgical, family, and social history reviewed.    Allergies: has No Known Allergies.    Medications:   Meds:   acetaminophen  1,000 mg Oral Q8H    enoxaparin (LOVENOX) injection  40 mg Subcutaneous Q24H    ferrous sulfate  325 mg Oral Daily    ketorolac  30 mg Intravenous Q6H    lidocaine  1 patch Transdermal Daily Continuous Infusions:  PRN Meds:.aluminum-magnesium hydroxide-simethicone, melatonin, traMADol    Objective:   Vitals: Temp:  [36.4 ??C (97.5 ??F)-36.7 ??C (98.1 ??F)] 36.5 ??C (97.7 ??F)  Heart Rate:  [82-106] 101  Resp:  [18-20] 18  BP: (99-124)/(73-83) 99/73  MAP (mmHg):  [81-95] 81  SpO2:  [96 %-100 %] 99 %  BMI (Calculated):  [36.11-36.18] 36.18    Alert in bed, in mild distress due to abdominal pain.    Breathing on RA, no accessory muscle movement  Appears well perfused  Abdomen tender to palpation over RUQ  Moving all extremities, face symmetric, speech intact, no focal deficits    Labs and imaging studies reviewed.

## 2022-06-26 NOTE — Unmapped (Signed)
Bed: 47-C  Expected date:   Expected time:   Means of arrival:   Comments:  Discharges and consults

## 2022-06-27 MED ADMIN — sucralfate (CARAFATE) oral suspension: 1 g | ORAL | @ 23:00:00

## 2022-06-27 MED ADMIN — SMOG ENEMA: 240 mL | RECTAL | @ 19:00:00 | Stop: 2022-06-27

## 2022-06-27 MED ADMIN — sucralfate (CARAFATE) oral suspension: 1 g | ORAL | @ 11:00:00

## 2022-06-27 MED ADMIN — ketorolac (TORADOL) injection 30 mg: 30 mg | INTRAVENOUS | @ 02:00:00 | Stop: 2022-06-30

## 2022-06-27 MED ADMIN — nitrofurantoin (macrocrystal-monohydrate) (MACROBID) 100 MG capsule 100 mg: 100 mg | ORAL | @ 01:00:00 | Stop: 2022-07-03

## 2022-06-27 MED ADMIN — lidocaine 4 % patch 1 patch: 1 | TRANSDERMAL | @ 14:00:00

## 2022-06-27 MED ADMIN — ondansetron (ZOFRAN-ODT) disintegrating tablet 4 mg: 4 mg | ORAL | @ 11:00:00

## 2022-06-27 MED ADMIN — magnesium hydroxide (MILK OF MAGNESIA) oral suspension: 30 mL | ORAL | @ 23:00:00

## 2022-06-27 MED ADMIN — traMADol (ULTRAM) tablet 50 mg: 50 mg | ORAL | @ 23:00:00

## 2022-06-27 MED ADMIN — ketorolac (TORADOL) injection 30 mg: 30 mg | INTRAVENOUS | @ 19:00:00 | Stop: 2022-06-30

## 2022-06-27 MED ADMIN — ondansetron (ZOFRAN-ODT) disintegrating tablet 4 mg: 4 mg | ORAL | @ 01:00:00

## 2022-06-27 MED ADMIN — ketorolac (TORADOL) injection 30 mg: 30 mg | INTRAVENOUS | @ 09:00:00 | Stop: 2022-06-30

## 2022-06-27 MED ADMIN — ketorolac (TORADOL) injection 30 mg: 30 mg | INTRAVENOUS | @ 14:00:00 | Stop: 2022-06-30

## 2022-06-27 MED ADMIN — ferrous sulfate tablet 325 mg: 325 mg | ORAL | @ 14:00:00

## 2022-06-27 MED ADMIN — enoxaparin (LOVENOX) syringe 40 mg: 40 mg | SUBCUTANEOUS | @ 01:00:00

## 2022-06-27 MED ADMIN — acetaminophen (TYLENOL) tablet 1,000 mg: 1000 mg | ORAL | @ 09:00:00

## 2022-06-27 MED ADMIN — traMADol (ULTRAM) tablet 50 mg: 50 mg | ORAL | @ 15:00:00

## 2022-06-27 MED ADMIN — acetaminophen (TYLENOL) tablet 1,000 mg: 1000 mg | ORAL | @ 01:00:00

## 2022-06-27 MED ADMIN — ondansetron (ZOFRAN) injection 4 mg: 4 mg | INTRAVENOUS | @ 15:00:00

## 2022-06-27 MED ADMIN — sucralfate (CARAFATE) oral suspension: 1 g | ORAL | @ 17:00:00

## 2022-06-27 MED ADMIN — pantoprazole (Protonix) EC tablet 40 mg: 40 mg | ORAL | @ 14:00:00

## 2022-06-27 MED ADMIN — sucralfate (CARAFATE) oral suspension: 1 g | ORAL | @ 06:00:00

## 2022-06-27 MED ADMIN — polyethylene glycol (MIRALAX) packet 17 g: 17 g | ORAL | @ 15:00:00

## 2022-06-27 MED ADMIN — lactulose oral solution: 20 g | ORAL | @ 15:00:00 | Stop: 2022-06-27

## 2022-06-27 MED ADMIN — nitrofurantoin (macrocrystal-monohydrate) (MACROBID) 100 MG capsule 100 mg: 100 mg | ORAL | @ 14:00:00 | Stop: 2022-06-27

## 2022-06-27 MED ADMIN — acetaminophen (TYLENOL) tablet 1,000 mg: 1000 mg | ORAL | @ 19:00:00

## 2022-06-27 MED ADMIN — predniSONE (DELTASONE) tablet 5 mg: 5 mg | ORAL | @ 18:00:00

## 2022-06-27 MED ADMIN — magnesium hydroxide (MILK OF MAGNESIA) oral suspension: 30 mL | ORAL | @ 01:00:00

## 2022-06-27 MED ADMIN — amoxicillin (AMOXIL) capsule 500 mg: 500 mg | ORAL | @ 19:00:00 | Stop: 2022-07-02

## 2022-06-27 NOTE — Unmapped (Signed)
Daily Progress Note    Assessment/Plan:    Principal Problem:    Thrombosis  Active Problems:    RUQ pain  Resolved Problems:    * No resolved hospital problems. *                 Alicia Norris is a 22 y.o. female who presented to [facility] with Thrombosis.    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 Given history of RUQ, worse with movement, this seems like an unrelated finding. Patient also is not convinced that her pain is related to this thrombosis. She has a history of 3 prior pregnancies, last resulted in birth in Oct 2022. Currently not pregnant and Paragard IUD in place. 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 No signs of sepsis at this time. Admitting provider spoke directly with  hematology, who felt workup for hypercoagulability (inpatient vs outpatient) would be appropriate and recommended anticoagulation given abdominal pain. OB GYN was consulted by ED and left treatment plan note indicating that [facility] typically does not change outcomes outside of the setting of symptoms related to thrombosis, septic thrombosis, or simultaneous DVT/PE. CT chest at OSH without PE. Patient denies any recent long travel, immobility, or LE pain/swelling. Discussed anticoagulation with hematology, and given uncertain benefits, hematology recommended against starting anticoagulation.  CRP, sed rate, D dimer all wnl.   - appreciate hematology consult,  - consider STD testing or LE dopplers if appropriate per heme +/-  OBGYN        Secondary/Additional Active Problems:      RUQ pain- TTP and worse with movement. She denies any significant event prior to pain starting that would be indicative of strain injury of her back. She does have 3 young children. Has not improved with relatively low dose anti-inflammatory treatments or heat at home. No improvement with GI cocktail. Initially did not think PO intake worsens/improves pain, but now feels pain worse with eating, and does get nauseated. Hx cholecystectomy in the past. KUB with moderate colonic stool burden  - tylenol 1000 q8hr  - toradol q6hr  - lidocaine patches   - flexeril prn  - start carafate QID  - start pantoprazole    UTI: U/A with 17 wbc, lots of squams, but occasional bacteria. UCX grew mixed urogenital flora, possible group B strep.  - start ampicillin 500mg  TID x5 days     Constipation: KUB with moderate colonic stool burden. Given MOM x1, lactulose x1.  Pt hesitant to use enema, and no BM despite oral meds.  - repeat lactulose x1  - encourage smog enema PRN   - MoM prn    Seropositive RA: ESR, CRP wnl.   - patient on enbrel, follows with Cunningham at [facility].   - per pt, uses prednisone 5mg  PRN for joint pain, will continue that here     Anemia  Currently normal hgb     ___________________________________________________________________    Subjective:  Pt continues to c/o RUQ abdominal pain with intermittent nausea and vomiting. Pt vomited this am, and reported that there was some blood in it, but nursing was unable to visualize at pt was in a shared bathroom. Despite oral meds, still no BM, which could contribute to nausea/vomiting.   Unsure that we will have a source of her ongoing RUQ abdominal/chest discomfort.             Labs/Studies:  Labs and Studies from the  last 24hrs per EMR and Reviewed    Objective:  Temp:  [36 ??C (96.8 ??F)-36.9 ??C (98.4 ??F)] 36.9 ??C (98.4 ??F)  Heart Rate:  [80-91] 87  Resp:  [18] 18  BP: (97-113)/(60-69) 103/66  SpO2:  [99 %-100 %] 99 %    GEN: alert, cooperative female, in NAD  HEENT: EOMI, Sclera anicteric, MMM  CARDS: RRR, no murmurs noted  PULM: CTA B, no wheezes or crackles, good air movement, no increased work of breathing on RA  PSYCH: cslm, cooperative  ABD: soft, TTP in the RUQ under the ribs without rebound, not TTP at any other location, no guarding   EXTREM: no LE edema  NEURO: grossly nonfocal, speech clear, moving all extremities equally

## 2022-06-27 NOTE — Unmapped (Signed)
Problem: Adult Inpatient Plan of Care  Goal: Plan of Care Review  Outcome: Ongoing - Unchanged  Flowsheets (Taken 06/26/2022 1958)  Plan of Care Reviewed With: patient  Goal: Patient-Specific Goal (Individualized)  Outcome: Ongoing - Unchanged  Goal: Absence of Hospital-Acquired Illness or Injury  Outcome: Ongoing - Unchanged  Goal: Optimal Comfort and Wellbeing  Outcome: Ongoing - Unchanged  Goal: Readiness for Transition of Care  Outcome: Ongoing - Unchanged  Goal: Rounds/Family Conference  Outcome: Ongoing - Unchanged

## 2022-06-28 LAB — CBC
HEMATOCRIT: 37 % (ref 34.0–44.0)
HEMOGLOBIN: 12.1 g/dL (ref 11.3–14.9)
MEAN CORPUSCULAR HEMOGLOBIN CONC: 32.7 g/dL (ref 32.0–36.0)
MEAN CORPUSCULAR HEMOGLOBIN: 25.3 pg — ABNORMAL LOW (ref 25.9–32.4)
MEAN CORPUSCULAR VOLUME: 77.3 fL — ABNORMAL LOW (ref 77.6–95.7)
MEAN PLATELET VOLUME: 7.3 fL (ref 6.8–10.7)
PLATELET COUNT: 370 10*9/L (ref 150–450)
RED BLOOD CELL COUNT: 4.79 10*12/L (ref 3.95–5.13)
RED CELL DISTRIBUTION WIDTH: 16.2 % — ABNORMAL HIGH (ref 12.2–15.2)
WBC ADJUSTED: 11.4 10*9/L — ABNORMAL HIGH (ref 3.6–11.2)

## 2022-06-28 LAB — BASIC METABOLIC PANEL
ANION GAP: 5 mmol/L (ref 5–14)
BLOOD UREA NITROGEN: 10 mg/dL (ref 9–23)
BUN / CREAT RATIO: 14
CALCIUM: 9.2 mg/dL (ref 8.7–10.4)
CHLORIDE: 110 mmol/L — ABNORMAL HIGH (ref 98–107)
CO2: 26 mmol/L (ref 20.0–31.0)
CREATININE: 0.74 mg/dL
EGFR CKD-EPI (2021) FEMALE: >90 mL/min/{1.73_m2} (ref >=60)
GLUCOSE RANDOM: 113 mg/dL (ref 70–179)
POTASSIUM: 3.8 mmol/L (ref 3.4–4.8)
SODIUM: 141 mmol/L (ref 135–145)

## 2022-06-28 MED ORDER — AMOXICILLIN 500 MG CAPSULE
ORAL_CAPSULE | Freq: Three times a day (TID) | ORAL | 0 refills | 4 days | Status: CP
Start: 2022-06-28 — End: 2022-07-01

## 2022-06-28 MED ORDER — ACETAMINOPHEN 500 MG TABLET
ORAL_TABLET | Freq: Three times a day (TID) | ORAL | 0 refills | 5 days | Status: CP
Start: 2022-06-28 — End: ?

## 2022-06-28 MED ORDER — PROMETHAZINE 12.5 MG TABLET
ORAL_TABLET | Freq: Four times a day (QID) | ORAL | 0 refills | 4 days | Status: CP | PRN
Start: 2022-06-28 — End: 2022-07-05

## 2022-06-28 MED ORDER — MAGNESIUM HYDROXIDE 400 MG/5 ML ORAL SUSPENSION
Freq: Two times a day (BID) | ORAL | 0 refills | 2 days | Status: CP | PRN
Start: 2022-06-28 — End: 2022-07-28

## 2022-06-28 MED ORDER — TRAMADOL 50 MG TABLET
ORAL_TABLET | Freq: Four times a day (QID) | ORAL | 0 refills | 5 days | Status: CP | PRN
Start: 2022-06-28 — End: 2022-07-03

## 2022-06-28 MED ORDER — SENNOSIDES 8.6 MG TABLET
ORAL_TABLET | Freq: Two times a day (BID) | ORAL | 0 refills | 30 days | Status: CP
Start: 2022-06-28 — End: 2022-07-28

## 2022-06-28 MED ORDER — POLYETHYLENE GLYCOL 3350 17 GRAM ORAL POWDER PACKET
PACK | Freq: Two times a day (BID) | ORAL | 0 refills | 30 days | Status: CP
Start: 2022-06-28 — End: 2022-07-28

## 2022-06-28 MED ADMIN — ketorolac (TORADOL) injection 30 mg: 30 mg | INTRAVENOUS | @ 02:00:00 | Stop: 2022-06-30

## 2022-06-28 MED ADMIN — polyethylene glycol (MIRALAX) packet 17 g: 17 g | ORAL | @ 13:00:00 | Stop: 2022-06-28

## 2022-06-28 MED ADMIN — ketorolac (TORADOL) injection 30 mg: 30 mg | INTRAVENOUS | @ 19:00:00 | Stop: 2022-06-28

## 2022-06-28 MED ADMIN — traMADol (ULTRAM) tablet 50 mg: 50 mg | ORAL | @ 14:00:00 | Stop: 2022-06-28

## 2022-06-28 MED ADMIN — pantoprazole (Protonix) EC tablet 40 mg: 40 mg | ORAL | @ 13:00:00 | Stop: 2022-06-28

## 2022-06-28 MED ADMIN — amoxicillin (AMOXIL) capsule 500 mg: 500 mg | ORAL | @ 19:00:00 | Stop: 2022-06-28

## 2022-06-28 MED ADMIN — acetaminophen (TYLENOL) tablet 1,000 mg: 1000 mg | ORAL | @ 11:00:00 | Stop: 2022-06-28

## 2022-06-28 MED ADMIN — sucralfate (CARAFATE) oral suspension: 1 g | ORAL | @ 04:00:00

## 2022-06-28 MED ADMIN — polyethylene glycol (MIRALAX) packet 17 g: 17 g | ORAL | @ 02:00:00

## 2022-06-28 MED ADMIN — amoxicillin (AMOXIL) capsule 500 mg: 500 mg | ORAL | @ 04:00:00 | Stop: 2022-07-02

## 2022-06-28 MED ADMIN — ketorolac (TORADOL) injection 30 mg: 30 mg | INTRAVENOUS | @ 11:00:00 | Stop: 2022-06-28

## 2022-06-28 MED ADMIN — acetaminophen (TYLENOL) tablet 1,000 mg: 1000 mg | ORAL | @ 19:00:00 | Stop: 2022-06-28

## 2022-06-28 MED ADMIN — ondansetron (ZOFRAN) injection 4 mg: 4 mg | INTRAVENOUS | @ 02:00:00

## 2022-06-28 MED ADMIN — enoxaparin (LOVENOX) syringe 40 mg: 40 mg | SUBCUTANEOUS | @ 02:00:00

## 2022-06-28 MED ADMIN — amoxicillin (AMOXIL) capsule 500 mg: 500 mg | ORAL | @ 11:00:00 | Stop: 2022-06-28

## 2022-06-28 MED ADMIN — ferrous sulfate tablet 325 mg: 325 mg | ORAL | @ 13:00:00 | Stop: 2022-06-28

## 2022-06-28 MED ADMIN — lidocaine 4 % patch 1 patch: 1 | TRANSDERMAL | @ 13:00:00 | Stop: 2022-06-28

## 2022-06-28 MED ADMIN — acetaminophen (TYLENOL) tablet 1,000 mg: 1000 mg | ORAL | @ 02:00:00

## 2022-06-28 MED ADMIN — ondansetron (ZOFRAN) injection 4 mg: 4 mg | INTRAVENOUS | @ 11:00:00 | Stop: 2022-06-28

## 2022-06-28 MED ADMIN — promethazine (PHENERGAN) tablet 12.5 mg: 12.5 mg | ORAL | @ 13:00:00 | Stop: 2022-06-28

## 2022-06-28 MED ADMIN — senna (SENOKOT) tablet 2 tablet: 2 | ORAL | @ 02:00:00

## 2022-06-28 MED ADMIN — predniSONE (DELTASONE) tablet 5 mg: 5 mg | ORAL | @ 13:00:00 | Stop: 2022-06-28

## 2022-06-28 MED ADMIN — sucralfate (CARAFATE) oral suspension: 1 g | ORAL | @ 17:00:00 | Stop: 2022-06-28

## 2022-06-28 NOTE — Unmapped (Signed)
Problem: Adult Inpatient Plan of Care  Goal: Plan of Care Review  Outcome: Progressing  Goal: Patient-Specific Goal (Individualized)  Outcome: Progressing  Goal: Absence of Hospital-Acquired Illness or Injury  Outcome: Progressing  Intervention: Identify and Manage Fall Risk  Recent Flowsheet Documentation  Taken 06/28/2022 0200 by Dala Dock, RN  Safety Interventions:   lighting adjusted for tasks/safety   low bed  Taken 06/28/2022 0000 by Dala Dock, RN  Safety Interventions:   lighting adjusted for tasks/safety   low bed  Taken 06/27/2022 2200 by Dala Dock, RN  Safety Interventions:   lighting adjusted for tasks/safety   low bed  Taken 06/27/2022 2000 by Dala Dock, RN  Safety Interventions:   low bed   lighting adjusted for tasks/safety  Goal: Optimal Comfort and Wellbeing  Outcome: Progressing  Goal: Readiness for Transition of Care  Outcome: Progressing  Goal: Rounds/Family Conference  Outcome: Progressing     Problem: VTE (Venous Thromboembolism)  Goal: Tissue Perfusion  Outcome: Progressing  Goal: Right Ventricular Function  Outcome: Progressing     Problem: Pain Acute  Goal: Optimal Pain Control and Function  Outcome: Progressing

## 2022-06-28 NOTE — Unmapped (Addendum)
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 Given history of RUQ, worse with movement, this did not seem likely to be related to her pain. She has a history of 3 prior pregnancies, last resulted in birth in Oct 2022. Currently not pregnant and Paragard IUD in place. 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 No signs of sepsis on admission.  OB GYN was consulted by ED and left treatment plan note indicating that [facility] typically does not change outcomes outside of the setting of symptoms related to thrombosis, septic thrombosis, or simultaneous DVT/PE. CT chest at OSH without PE. Patient denies any recent long travel, immobility, or LE pain/swelling. Discussed anticoagulation with hematology, and given uncertain benefits, hematology recommended against starting anticoagulation.  CRP, sed rate, D dimer all wnl.       RUQ pain- TTP and pain worse with movement. She denies any significant event prior to pain starting that would be indicative of strain injury of her back. She does have 3 young children. Has not improved with relatively low dose anti-inflammatory treatments or heat at home. No improvement with GI cocktail. Initially did not think PO intake worsens/improves pain, but now feels pain worse with eating, and does get nauseated. Hx cholecystectomy in the past. KUB with moderate colonic stool burden. Provided pain control with scheduled tylenol TID, toradol q6, lidocaine patches, flexeril PRN. Treated for possible UTI and constipation (see below) Despite treatment, pt pain relatively unchanged and no source identified. Pt discharged with PRN tramadol, prn flexeril, as well as PRN promethazine for nausea.      UTI: U/A with 17 wbc, lots of squams, but occasional bacteria. UCX grew mixed urogenital flora, possible group B strep and started ampicillin 500mg  TID x5 days.        Constipation: KUB with moderate colonic stool burden. Given MOM, lactulose, and SMOG enema. Pt passing liquid stool by day of discharge. Recommend pt continue Miralax BID, senna BID until more solid stool given colonic stool burden.      Seropositive RA: ESR, CRP wnl.  patient on enbrel, follows with Cunningham at [facility].  per pt, uses prednisone 5mg  PRN for joint pain, and this was continued here.

## 2022-06-28 NOTE — Unmapped (Signed)
Problem: Adult Inpatient Plan of Care  Goal: Plan of Care Review  Outcome: Resolved  Goal: Patient-Specific Goal (Individualized)  Outcome: Resolved  Goal: Absence of Hospital-Acquired Illness or Injury  Outcome: Resolved  Intervention: Identify and Manage Fall Risk  Recent Flowsheet Documentation  Taken 06/28/2022 0800 by Barbaraann Boys, RN  Safety Interventions:   lighting adjusted for tasks/safety   low bed   nonskid shoes/slippers when out of bed  Goal: Optimal Comfort and Wellbeing  Outcome: Resolved  Goal: Readiness for Transition of Care  Outcome: Resolved  Goal: Rounds/Family Conference  Outcome: Resolved     Problem: VTE (Venous Thromboembolism)  Goal: Tissue Perfusion  Outcome: Resolved  Goal: Right Ventricular Function  Outcome: Resolved     Problem: Pain Acute  Goal: Optimal Pain Control and Function  Outcome: Resolved

## 2022-06-28 NOTE — Unmapped (Signed)
Physician Discharge Summary [facility]  [address] [facility]  [address]  [place] [address]  Dept: [phone]  Loc: [phone]     Identifying Information:   Alicia Norris  2001/03/14  [id number]    Primary Care Physician: [facility]   Code Status: Full Code    Admit Date: 06/25/2022    Discharge Date: 06/28/2022     Discharge To: Home    Discharge Service: [facility] APP     Discharge Attending Physician: Elesa Massed, ANP    Discharge Diagnoses:  Principal Problem:    Thrombosis (POA: Yes)  Active Problems:    RUQ pain (POA: Yes)  Resolved Problems:    * No resolved hospital problems. *      Outpatient Provider Follow Up Issues:   Treating for possible gastritis/ulcer with pantoprazole  Treated for possible UTI    Hospital Course:   R Gonadal vein Thrombosis   Admitted for RUQ abd pain, CT abd/pelvis incidentally found thrombosis of R gonadal vein. Given history of RUQ, worse with movement, this did not seem likely to be related to her pain. She has a history of 3 prior pregnancies, last resulted in birth in Oct 2022. Currently not pregnant and Paragard IUD in place. 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 No signs of sepsis on admission.  OB GYN was consulted by ED and left treatment plan note indicating that [facility] typically does not change outcomes outside of the setting of symptoms related to thrombosis, septic thrombosis, or simultaneous DVT/PE. CT chest at OSH without PE. Patient denies any recent long travel, immobility, or LE pain/swelling. Discussed anticoagulation with hematology, and given uncertain benefits, hematology recommended against starting anticoagulation.  CRP, sed rate, D dimer all wnl.       RUQ pain- TTP and pain worse with movement. She denies any significant event prior to pain starting that would be indicative of strain injury of her back. She does have 3 young children. Has not improved with relatively low dose anti-inflammatory treatments or heat at home. No improvement with GI cocktail. Initially did not think PO intake worsens/improves pain, but now feels pain worse with eating, and does get nauseated. Hx cholecystectomy in the past. KUB with moderate colonic stool burden. Provided pain control with scheduled tylenol TID, toradol q6, lidocaine patches, flexeril PRN. Treated for possible UTI and constipation (see below) Despite treatment, pt pain relatively unchanged and no source identified. Pt discharged with PRN tramadol, prn flexeril, as well as PRN promethazine for nausea.      UTI: U/A with 17 wbc, lots of squams, but occasional bacteria. UCX grew mixed urogenital flora, possible group B strep and started ampicillin 500mg  TID x5 days.        Constipation: KUB with moderate colonic stool burden. Given MOM, lactulose, and SMOG enema. Pt passing liquid stool by day of discharge. Recommend pt continue Miralax BID, senna BID until more solid stool given colonic stool burden.      Seropositive RA: ESR, CRP wnl.  patient on enbrel, follows with [facility].  per pt, uses prednisone 5mg  PRN for joint pain, and this was continued here.        Procedures:    No admission procedures for hospital encounter.  ______________________________________________________________________  Discharge Medications:     Your Medication List        STOP taking these medications      empty container Misc     ferrous sulfate 325 (65  FE) MG EC tablet            START taking these medications      acetaminophen 500 MG tablet  Commonly known as: TYLENOL  Take 2 tablets (1,000 mg total) by mouth every eight (8) hours.     amoxicillin 500 MG capsule  Commonly known as: AMOXIL  Take 1 capsule (500 mg total) by mouth every eight (8) hours for 3 days.     magnesium hydroxide 400 mg/5 mL Susp  Commonly known as: MILK OF MAGNESIA  Take 30 mL by mouth two (2) times a day as needed.     pantoprazole 40 MG tablet  Commonly known as: Protonix  Take 1 tablet (40 mg total) by mouth daily.  Start taking on: June 29, 2022     polyethylene glycol 17 gram packet  Commonly known as: MIRALAX  Take 17 g by mouth two (2) times a day. Can increase to 3-4 times a day if not having BM's     promethazine 12.5 MG tablet  Commonly known as: PHENERGAN  Take 1 tablet (12.5 mg total) by mouth every six (6) hours as needed for up to 7 days.     senna 8.6 mg tablet  Commonly known as: SENNA  Take 2 tablets by mouth two (2) times a day.     traMADol 50 mg tablet  Commonly known as: ULTRAM  Take 1 tablet (50 mg total) by mouth every six (6) hours as needed for up to 5 days.            CONTINUE taking these medications      cyclobenzaprine 5 MG tablet  Commonly known as: FLEXERIL  Take 1 tablet (5 mg total) by mouth Three (3) times a day as needed for muscle spasms.     EnbreL SureClick 50 mg/mL (1 mL) Pnij  Generic drug: etanercept  Inject the contents of 1 pen (50 mg total) under the skin every seven (7) days.     PARAGARD T 380A 380 square mm Iud intrauterine device  Generic drug: copper 380 square MM  1 each by Intrauterine route once.     predniSONE 5 MG tablet  Commonly known as: DELTASONE  TAKE 1 TABLET (5 MG TOTAL) BY MOUTH DAILY.              Allergies:  Patient has no known allergies.  ______________________________________________________________________  Pending Test Results (if blank, then none):      Most Recent Labs:  All lab results last 24 hours -   Recent Results (from the past 24 hour(s))   CBC    Collection Time: 06/28/22 10:35 AM   Result Value Ref Range    WBC 11.4 (H) 3.6 - 11.2 10*9/L    RBC 4.79 3.95 - 5.13 10*12/L    HGB 12.1 11.3 - 14.9 g/dL    HCT 16.1 09.6 - 04.5 %    MCV 77.3 (L) 77.6 - 95.7 fL    MCH 25.3 (L) 25.9 - 32.4 pg    MCHC 32.7 32.0 - 36.0 g/dL    RDW 40.9 (H) 81.1 - 15.2 %    MPV 7.3 6.8 - 10.7 fL    Platelet 370 150 - 450 10*9/L   Basic Metabolic Panel    Collection Time: 06/28/22 10:35 AM   Result Value Ref Range    Sodium 141 135 - 145 mmol/L    Potassium 3.8 3.4 - 4.8 mmol/L    Chloride 110 (H)  98 - 107 mmol/L    CO2 26.0 20.0 - 31.0 mmol/L    Anion Gap 5 5 - 14 mmol/L    BUN 10 9 - 23 mg/dL    Creatinine 0.98 1.19 - 1.02 mg/dL    BUN/Creatinine Ratio 14     eGFR CKD-EPI (2021) Female >90 >=60 mL/min/1.40m2    Glucose 113 70 - 179 mg/dL    Calcium 9.2 8.7 - 14.7 mg/dL       Relevant Studies/Radiology (if blank, then none):  XR Abdomen 1 View    Result Date: 06/26/2022  EXAM: XR ABDOMEN 1 VIEW ACCESSION: [id number] UN CLINICAL INDICATION: 22 years old with ABDOMINAL PAIN   -  RIGHT LOWER QUADRANT  COMPARISON: 06/25/2022 contrast-enhanced CT of the abdomen and pelvis TECHNIQUE: Supine view of the abdomen. FINDINGS: Unremarkable lung bases. Right upper quadrant cholecystectomy clips. Nondilated small and large bowel. Moderate colonic solid stool burden. T-shaped IUD.     Nonobstructive bowel gas pattern with moderate diffuse colonic solid stool burden.    CT Abdomen Pelvis W IV Contrast Only    Result Date: 06/25/2022  EXAM: CT ABDOMEN PELVIS W CONTRAST ACCESSION: [id number] UN CLINICAL INDICATION: 22 years old with RUQ pain, cholecystectomy 4 yrs ago ; Abdominal pain, acute, nonlocalized  COMPARISON: None TECHNIQUE: A helical CT scan of the abdomen and pelvis was obtained following IV contrast from the lung bases through the pubic symphysis. Images were reconstructed in the axial plane. Coronal and sagittal reformatted images were also provided for further evaluation. FINDINGS: LOWER CHEST: Unremarkable. LIVER: Normal liver contour. Focal fat along the falciform ligament; no suspicious liver lesion identified. BILIARY: Status post cholecystectomy. No biliary ductal dilatation.  SPLEEN: Normal in size and contour. PANCREAS: Normal pancreatic contour.  No focal lesions.  No ductal dilation. ADRENAL GLANDS: Normal appearance of the adrenal glands. KIDNEYS/URETERS: Symmetric renal enhancement.  No hydronephrosis.  No solid renal mass. BLADDER: Unremarkable. REPRODUCTIVE ORGANS: IUD in place. GI TRACT: The stomach appears unremarkable. The loops of small bowel are normal in caliber. No evidence of acute colonic pathology. The appendix is not visualized. PERITONEUM, RETROPERITONEUM AND MESENTERY: No free air.  No ascites.  No fluid collection. LYMPH NODES: No adenopathy. VESSELS: Hepatic and portal veins are patent.  Normal caliber aorta. Note is made of right gonadal vein thrombus (series 2#73). BONES and SOFT TISSUES: No suspicious osseous lesion identified. No focal soft tissue lesions.     1. Right-sided gonadal vein thrombosis which may be the etiology of the patient's pain. 2. Additional findings, as described above.     ED POCUS Right Upper Quadrant    Result Date: 06/25/2022  Limited Biliary Ultrasound (CPT: 84132-44), Limited Cardiac Ultrasound (CPT [id number]), Limited Renal Ulrasound [phone]) Indication: A focused ultrasound exam of the gallbladder and right upper quadrant was performed to evaluate for gallstones, cholecystitis, and cholestasis in this patient. The ultrasound was performed with the following indications, as noted in the H&P: abdominal pain Identified structures: The common bile duct, liver, and porta hepatis were examined. Findings: Exam of the above structures revealed the following findings: Sonographic Murphy: Absent, distractable Common bile duct diameter: Normal        CBD diameter: .up to 0.4 cm Other findings: surgically absent gallbladder Limitations: None. Impression: Normal CBD exam Indication: A focused ultrasound exam of the heart was performed to evaluate for pericardial effusion, tamponade, severe hypovolemia, or gross abnormalities of cardiac anatomy or function in this patient. The ultrasound was performed with the following indications, as noted in  the H&P: Other indications as noted in the H&P Identified structures: The pericardial sac, myocardium, and 4 chambers were identified using the following views: subxiphoid, parasternal long axis, parasternal short axis, and apical 4-chamber Findings: Exam of the above structures revealed the following findings:  Pericardial effusion: Absent   Pericardial tamponade: N/A  Global LV function: Normal  Right ventricular size: Normal   Signs of RV strain: N/A  Limitations: None. Impression: No sonographic evidence of significant cardiac dysfunction and No sonographic evidence of significant pericardial effusion Indication: A focused ultrasound of the kidneys was performed to evaluate for hydronephrosis and nephrolithiasis. The ultrasound was performed with the following indications, as noted in the H&P: Abdominal or flank pain Identified structures: Right kidney, Left kidney, and Bladder Findings: Exam of the above structures revealed the following findings:  Hydronephrosis: Absent   If present: N/A   If present: N/A  Intrarenal Calculi: Absent   If present: N/A  Ureteral Calculi: Absent   If present: N/A  Vesicular Calculi: Absent Limitations: None. Impression: Normal limited renal ultrasound, no evidence of hydronephrosis or calculi Interpreted by: Leslye Peer, MD  Quality Assurance  After review of the point-of-care ultrasound performed in this case I assess the overall image quality as: Image quality: Minimal criteria met for diagnosis, recognizable structures but with some technical or other flaws  The accuracy of interpretation of images as presented reflects a true negative. This study does  meet minimum criteria for credentialing and billing. Erle Crocker, MD    ______________________________________________________________________  Discharge Instructions:   Activity Instructions       Activity as tolerated              Diet Instructions       Discharge diet (specify)      Discharge Nutrition Therapy: Regular            Other Instructions       Call MD for:  persistent nausea or vomiting      Call MD for:  severe uncontrolled pain      Call MD for: Temperature > 38.5 Celsius ( > 101.3 Fahrenheit)      Discharge instructions      You should follow up with your PCP for any ongoing pain.    Discharge instructions      You were admitted to the hospital with right upper quadrant/rib pain. YOu were also seen at [facility] for this pain as well. Imaging, including CT of your chest, chest xray, abdominal US have all been relatively unremarkable. Your CT did find a gonadal vein thrombosis. This incidental finding does not seem to be related to, or causing your pain. Hematology and Gynecology were consulted and did not feel anticoagulation warranted.    A thorough work up has been done for your abdominal/rib pain, including imaging.  A U/A did show a possible UTI, and given your pain, we elected to treat, and started you on amoxiilllin for a UTI. You will need 3 more days of antibiotics to complete 5 day course.     An abdominal xray done on 2/12 did show a moderate colonic stool burden, or constipation.  Constipation can cause abdominal pain, as well as nausea and vomiting. You were given multiple laxative as well as an enema and were successfully passing liquid stool.  Based on imaging, I recommend you continue on laxatives.  On discharge, you can take miralax twice a day, and can even increase to three and four times a  day until you are stooling reqularly. YOu can also take senna 2 tabs twice a day. If those are unsuccessful, you can try dulcolax oral or suppositories. This is an over the counter medication that can be found at any drug store. Until your are regularly having bowel movements, you should hold your iron supplementation, as this can cause some constipation.    While a clear source for your abdominal pain has not been found, you are safe to discharge from the hospital. We did prescribe pantoprazole, an acid reducer or PPI for possible gastritis or a gastric ulcer. You can take pantoprazole once daily. You should avoid steroids, NSAID's (ibuprofen, goody's powders, aleve) and avoid caffeine and spicy foods. If your pain persists, you should follow up with your PCP for further management.            Follow Up instructions and Outpatient Referrals     Call MD for:  persistent nausea or vomiting      Call MD for:  severe uncontrolled pain      Call MD for: Temperature > 38.5 Celsius ( > 101.3 Fahrenheit)      Discharge instructions      Discharge instructions          Appointments which have been scheduled for you      Dec 28, 2022  9:20 AM  (Arrive by 9:05 AM)  RETURN  RHEUMATOLOGY with Lanetta Inch, MD  [facility] [place] [facility]) [address]  [facility] [address]  [place] [address]  [phone]             ______________________________________________________________________  Discharge Day Services:  BP 115/80  - Pulse 88  - Temp 36.1 ??C (96.9 ??F) (Oral)  - Resp 17  - Ht 154.9 cm (5' 0.98)  - Wt 86.8 kg (191 lb 6.4 oz)  - SpO2 98%  - BMI 36.18 kg/m??   Pt seen on the day of discharge and determined appropriate for discharge.    Condition at Discharge: good    Length of Discharge: I spent greater than 30 mins in the discharge of this patient.

## 2022-06-29 MED ORDER — PANTOPRAZOLE 40 MG TABLET,DELAYED RELEASE
ORAL_TABLET | Freq: Every day | ORAL | 0 refills | 30 days | Status: CP
Start: 2022-06-29 — End: 2022-07-29

## 2022-07-05 MED ORDER — PREDNISONE 5 MG TABLET
ORAL_TABLET | Freq: Every day | ORAL | 5 refills | 30 days
Start: 2022-07-05 — End: ?

## 2022-07-05 NOTE — Unmapped (Signed)
[facility] Refill Coordination Note    Specialty Medication(s) to be Shipped:   Inflammatory Disorders: Enbrel    Other medication(s) to be shipped: No additional medications requested for fill at this time     [facility], DOB: [birth year]  Phone: [phone] (home)       All above HIPAA information was verified with patient.     Was a Nurse, learning disability used for this call? No    Completed refill call assessment today to schedule patient's medication shipment from the [facility] [phone]).  All relevant notes have been reviewed.     Specialty medication(s) and dose(s) confirmed: Regimen is correct and unchanged.   Changes to medications: Adhara reports no changes at this time.  Changes to insurance: No  New side effects reported not previously addressed with a pharmacist or physician: None reported  Questions for the pharmacist: No    Confirmed patient received a Conservation officer, historic buildings and a Surveyor, mining with first shipment. The patient will receive a drug information handout for each medication shipped and additional FDA Medication Guides as required.       DISEASE/MEDICATION-SPECIFIC INFORMATION        For patients on injectable medications: Patient currently has 0 doses left.  Next injection is scheduled for 07/06/22.    SPECIALTY MEDICATION ADHERENCE     Medication Adherence    Patient reported X missed doses in the last month: 2  Specialty Medication: Enbrel  Patient is on additional specialty medications: No  Patient is on more than two specialty medications: No              Were doses missed due to medication being on hold? No    Enbrel 50 mg/ml: 0 days of medicine on hand        REFERRAL TO PHARMACIST     Referral to the pharmacist: Not needed      [facility]     Shipping address confirmed in Epic.     Delivery Scheduled: Yes, Expected medication delivery date: 07/07/22.     Medication will be delivered via UPS to the prescription address in Epic WAM.    Ernestine Mcmurray   [facility] Specialty Technician

## 2022-07-06 MED ORDER — PREDNISONE 5 MG TABLET
ORAL_TABLET | Freq: Every day | ORAL | 5 refills | 30 days | Status: CP
Start: 2022-07-06 — End: ?
  Filled 2022-07-11: qty 30, 30d supply, fill #0

## 2022-07-06 NOTE — Unmapped (Signed)
Prednisone refill  Last ov: 06/23/2022  Next ov: 12/28/2022

## 2022-07-06 NOTE — Unmapped (Signed)
Enbrel and Prednisone will be sent out on 07/11/22 due to insurance processor is currently down. Will move refill call out three weeks.

## 2022-07-06 NOTE — Unmapped (Signed)
Ellaree Pulido Mickel 's EnbreL SureClick 50 mg/mL (1 mL) Pnij (etanercept) shipment will be delayed as a result of   insurance processor being down    I have reached out to the patient  at [phone] and left a voicemail message.  We will wait for a call back from the patient to reschedule the delivery.  We have not confirmed the new delivery date.

## 2022-08-01 NOTE — Unmapped (Signed)
3/19-LVM for patient at [phone] to follow-up on MyChart request to speak to pharmacist.  Will await patient call back to review any concerns.     Baker Janus  [facility]   [phone] opt 4

## 2022-08-01 NOTE — Unmapped (Signed)
[facility] Refill Coordination Note    [facility] [place], [place]: 12-Jan-2001  Phone: [phone] (home)       All above HIPAA information was verified with patient.         07/31/2022     2:07 PM   Specialty Rx Medication Refill Questionnaire   Which Medications would you like refilled and shipped? Enbrel and prednisone please   Please list all current allergies: Cats   Have you missed any doses in the last 30 days? No   Have you had any changes to your medication(s) since your last refill? No   How many days remaining of each medication do you have at home? None   If receiving an injectable medication, next injection date is 08/03/2022   Have you experienced any side effects in the last 30 days? No   Please enter the full address (street address, city, state, zip code) where you would like your medication(s) to be delivered to. [address] [place] [address]   Please specify on which day you would like your medication(s) to arrive. Note: if you need your medication(s) within 3 days, please call the pharmacy to schedule your order at [phone]  08/03/2022   Has your insurance changed since your last refill? No   Would you like a pharmacist to call you to discuss your medication(s)? Yes   Please enter the preferred phone number where you can be reached. [phone]   Do you require a signature for your package? (Note: if we are billing Medicare Part B or your order contains a controlled substance, we will require a signature) No         Completed refill call assessment today to schedule patient's medication shipment from the [facility] [phone]).  All relevant notes have been reviewed.       Confirmed patient received a Conservation officer, historic buildings and a Surveyor, mining with first shipment. The patient will receive a drug information handout for each medication shipped and additional FDA Medication Guides as required.         REFERRAL TO PHARMACIST     Referral to the pharmacist: Not needed      [facility]     Shipping address confirmed in Epic.     Delivery Scheduled: Yes, Expected medication delivery date: 08/03/2022.     Medication will be delivered via UPS to the prescription address in Epic WAM.    Kerby Less   [facility] Specialty Technician

## 2022-08-02 MED FILL — ENBREL SURECLICK 50 MG/ML (1 ML) SUBCUTANEOUS PEN INJECTOR: SUBCUTANEOUS | 28 days supply | Qty: 4 | Fill #1

## 2022-08-03 MED FILL — PREDNISONE 5 MG TABLET: ORAL | 30 days supply | Qty: 30 | Fill #1

## 2022-08-28 NOTE — Unmapped (Signed)
[name]    Specialty Medication(s) to be Shipped:   Inflammatory Disorders: Enbrel    Other medication(s) to be shipped:        [name], DOB: 04-07-2001  Phone: [phone] (home)       All above HIPAA information was verified with patient.     Was a Nurse, learning disability used for this call? No    Completed refill call assessment today to schedule patient's medication shipment from the [facility] [phone]).  All relevant notes have been reviewed.     Specialty medication(s) and dose(s) confirmed: Regimen is correct and unchanged.   Changes to medications: Alicia Norris reports no changes at this time.  Changes to insurance: No  New side effects reported not previously addressed with a pharmacist or physician: None reported  Questions for the pharmacist: No    Confirmed patient received a Conservation officer, historic buildings and a Surveyor, mining with first shipment. The patient will receive a drug information handout for each medication shipped and additional FDA Medication Guides as required.       DISEASE/MEDICATION-SPECIFIC INFORMATION        For patients on injectable medications: Patient currently has 0 doses left.  Next injection is scheduled for 08/31/22.    SPECIALTY MEDICATION ADHERENCE     Medication Adherence    Specialty Medication: EnbreL SureClick 50 mg/mL (1 mL) Pnij (etanercept)  Patient is on additional specialty medications: Yes  Additional Specialty Medications: predniSONE 5 MG tablet (DELTASONE)              Were doses missed due to medication being on hold? No    Enbrel 50 mg/ml: 0 days of medicine on hand        REFERRAL TO PHARMACIST     Referral to the pharmacist: Not needed      [facility]     Shipping address confirmed in Epic.     Delivery Scheduled: Yes, Expected medication delivery date: 08/30/22.     Medication will be delivered via UPS to the prescription address in Epic WAM.    Alicia Norris Shared [facility] Specialty Technician

## 2022-08-29 MED FILL — ENBREL SURECLICK 50 MG/ML (1 ML) SUBCUTANEOUS PEN INJECTOR: SUBCUTANEOUS | 28 days supply | Qty: 4 | Fill #2

## 2022-09-26 DIAGNOSIS — M0579 Rheumatoid arthritis with rheumatoid factor of multiple sites without organ or systems involvement: Principal | ICD-10-CM

## 2022-09-26 NOTE — Unmapped (Unsigned)
[facility] Refill Coordination Note    Specialty Medication(s) to be Shipped:   EnbreL SureClick 50 mg/mL (1 mL) Pnij (etanercept)    Other medication(s) to be shipped: No additional medications requested for fill at this time     [facility], DOB: 11-13-2000  Phone: [phone] (home)       All above HIPAA information was verified with patient.     Was a Nurse, learning disability used for this call? No    Completed refill call assessment today to schedule patient's medication shipment from the [facility] [phone]).  All relevant notes have been reviewed.     Specialty medication(s) and dose(s) confirmed: Regimen is correct and unchanged.   Changes to medications: Bell reports no changes at this time.  Changes to insurance: No  New side effects reported not previously addressed with a pharmacist or physician: None reported  Questions for the pharmacist: No    Confirmed patient received a Conservation officer, historic buildings and a Surveyor, mining with first shipment. The patient will receive a drug information handout for each medication shipped and additional FDA Medication Guides as required.       DISEASE/MEDICATION-SPECIFIC INFORMATION        For patients on injectable medications: Patient currently has 1 doses left.  Next injection is scheduled for 09/29/22.    SPECIALTY MEDICATION ADHERENCE     Medication Adherence    Patient reported X missed doses in the last month: 0  Specialty Medication: EnbreL SureClick 50 mg/mL (1 mL) Pnij (etanercept)  Patient is on additional specialty medications: No  Patient is on more than two specialty medications: No  Any gaps in refill history greater than 2 weeks in the last 3 months: no  Demonstrates understanding of importance of adherence: yes              Were doses missed due to medication being on hold? No    EnbreL SureClick 50 mg/mL (1 mL) Pnij (etanercept): 0 days of medicine on hand       REFERRAL TO PHARMACIST     Referral to the pharmacist: Not needed      [facility]     Shipping address confirmed in Epic.       Delivery Scheduled: Yes, Expected medication delivery date: 05/.     Medication will be delivered via {Blank:[id number]::UPS,Next Day Courier,Same Day Courier,Clinic Courier - *** clinic,***} to the {Blank:[id number]::prescription,temporary} address in Epic WAM.    Alicia Norris   [facility] Specialty {Blank:[id number]::Pharmacist,Technician}

## 2022-09-27 MED FILL — PREDNISONE 5 MG TABLET: ORAL | 30 days supply | Qty: 30 | Fill #2

## 2022-09-27 MED FILL — ENBREL SURECLICK 50 MG/ML (1 ML) SUBCUTANEOUS PEN INJECTOR: SUBCUTANEOUS | 28 days supply | Qty: 4 | Fill #3

## 2022-10-02 ENCOUNTER — Ambulatory Visit: Payer: Medicaid Other | Admitting: Gastroenterology

## 2022-10-18 NOTE — Unmapped (Signed)
[facility] Clinical Assessment & Refill Coordination Note    Patient states she can barely walk right now.  This started about a week ago.  Patient started a new job and has more stress but unsure why symptoms have worsened in the past week.     [facility], [place]: August 28, 2000  Phone: [phone] (home)     All above HIPAA information was verified with patient.     Was a Nurse, learning disability used for this call? No    Specialty Medication(s):   Inflammatory Disorders: Enbrel     Current Outpatient Medications   Medication Sig Dispense Refill    acetaminophen (TYLENOL) 500 MG tablet Take 2 tablets (1,000 mg total) by mouth every eight (8) hours. 30 tablet 0    copper 380 square MM (PARAGARD T 380A) 380 square mm IUD intrauterine device 1 each by Intrauterine route once.      cyclobenzaprine (FLEXERIL) 5 MG tablet Take 1 tablet (5 mg total) by mouth Three (3) times a day as needed for muscle spasms. 60 tablet 0    ENBREL SURECLICK 50 MG/ML (0.98 ML) SUBCUTANEOUS PEN Inject the contents of 1 pen (50 mg total) under the skin every seven (7) days. 4 mL 11    predniSONE (DELTASONE) 5 MG tablet TAKE 1 TABLET (5 MG TOTAL) BY MOUTH DAILY. 30 tablet 5     No current facility-administered medications for this visit.        Changes to medications: Kattia reports no changes at this time.    No Known Allergies    Changes to allergies: No    SPECIALTY MEDICATION ADHERENCE         Medication Adherence    Patient reported X missed doses in the last month: 0  Specialty Medication: Enbrel  Patient is on additional specialty medications: No  Informant: patient          Specialty medication(s) dose(s) confirmed: Regimen is correct and unchanged.     Are there any concerns with adherence? No    Adherence counseling provided? Not needed    CLINICAL MANAGEMENT AND INTERVENTION      Clinical Benefit Assessment:    Do you feel the medicine is effective or helping your condition? Yes    Clinical Benefit counseling provided?  See note above, this was working well per 06/23/22 appt u    Adverse Effects Assessment:    Are you experiencing any side effects? No    Are you experiencing difficulty administering your medicine? No    Quality of Life Assessment:    Quality of Life    Rheumatology  Oncology  Dermatology  Cystic Fibrosis          How many days over the past month did your RA  keep you from your normal activities? For example, brushing your teeth or getting up in the morning. Every day right now    Have you discussed this with your provider? No - pharmacist will consult provider    Acute Infection Status:    Acute infections noted within Epic:  No active infections  Patient reported infection: None    Therapy Appropriateness:    Is therapy appropriate and patient progressing towards therapeutic goals? Pharmacist will consult provider    DISEASE/MEDICATION-SPECIFIC INFORMATION      For patients on injectable medications: Patient currently has 1 doses left.  Next injection is scheduled for 6/7.    Chronic Inflammatory Diseases: Have you experienced any flares in the  last month? Yes, possibly now  Has this been reported to your provider? Pharmacist to report to provider    PATIENT SPECIFIC NEEDS     Does the patient have any physical, cognitive, or cultural barriers? No    Is the patient high risk? No    Did the patient require a clinical intervention? No    Does the patient require physician intervention or other additional services (i.e., nutrition, smoking cessation, social work)? No    SOCIAL DETERMINANTS OF HEALTH     At the [facility], we have learned that life circumstances - like trouble affording food, housing, utilities, or transportation can affect the health of many of our patients.   That is why we wanted to ask: are you currently experiencing any life circumstances that are negatively impacting your health and/or quality of life? Patient declined to answer    Social Determinants of Health     Financial Resource Strain: Not on file   Internet Connectivity: Not on file   Food Insecurity: Not on file   Tobacco Use: Low Risk  (06/25/2022)    Patient History     Smoking Tobacco Use: Never     Smokeless Tobacco Use: Never     Passive Exposure: Never   Recent Concern: Tobacco Use - High Risk (06/22/2022)    Received from [facility] visits prior to 07/15/2022.    Patient History     Smoking Tobacco Use: Every Day     Smokeless Tobacco Use: Never     Passive Exposure: Not on file   Housing/Utilities: Not on file   Alcohol Use: Not on file   Transportation Needs: Not on file   Substance Use: Not on file   Health Literacy: Not on file   Physical Activity: Not on file   Interpersonal Safety: Not on file   Stress: Not on file   Intimate Partner Violence: Not on file   Depression: Not at risk (03/18/2021)    Received from [facility] visits prior to 07/15/2022.    PHQ-2     SDOH PHQ2 SCORE: 2   Social Connections: Not on file       Would you be willing to receive help with any of the needs that you have identified today? No       SHIPPING     Specialty Medication(s) to be Shipped:   Inflammatory Disorders: Enbrel    Other medication(s) to be shipped: No additional medications requested for fill at this time     Changes to insurance: No    Delivery Scheduled: Yes, Expected medication delivery date: 6/11.     Medication will be delivered via UPS to the confirmed prescription address in [facility].    The patient will receive a drug information handout for each medication shipped and additional FDA Medication Guides as required.  Verified that patient has previously received a Conservation officer, historic buildings and a Surveyor, mining.    The patient or caregiver noted above participated in the development of this care plan and knows that they can request review of or adjustments to the care plan at any time.      All of the patient's questions and concerns have been addressed.    [name], PharmD [facility] Specialty Pharmacist

## 2022-10-19 NOTE — Unmapped (Signed)
Called patient in response to message about flare, no answer. Left VM and sent mychart message.

## 2022-10-23 MED FILL — ENBREL SURECLICK 50 MG/ML (1 ML) SUBCUTANEOUS PEN INJECTOR: SUBCUTANEOUS | 28 days supply | Qty: 4 | Fill #4

## 2022-10-23 MED FILL — PREDNISONE 5 MG TABLET: ORAL | 30 days supply | Qty: 30 | Fill #3

## 2022-11-02 DIAGNOSIS — M059 Rheumatoid arthritis with rheumatoid factor, unspecified: Principal | ICD-10-CM

## 2022-11-02 MED ORDER — CIMZIA 400 MG/2 ML (200 MG/ML X 2) SUBCUTANEOUS SYRINGE KIT
SUBCUTANEOUS | 3 refills | 84 days | Status: CP
Start: 2022-11-02 — End: ?
  Filled 2022-11-08: qty 6, 34d supply, fill #0

## 2022-11-02 MED ORDER — CIMZIA STARTER KIT 400 MG/2 ML (200 MG/ML X2) SUBCUTANEOUS SYRINGE KIT
SUBCUTANEOUS | 0 refills | 42 days | Status: CP
Start: 2022-11-02 — End: 2022-12-01

## 2022-11-02 NOTE — Unmapped (Signed)
Called patient to discuss pregnancy plan, phone went straight to voicemail x6. Left VM

## 2022-11-02 NOTE — Unmapped (Signed)
Called patient to touch base this morning.  She believes she is [redacted] weeks pregnant currently.  Last took Enbrel last week.  Is on low-dose prednisone currently.  Joints are doing okay.    We discussed that Enbrel has not been studied well in pregnancy but Cimzia has.  Discussed that this is also a TNF inhibitor and I would expect adverse effect profile to be similar.  Reviewed TNF inhibitor adverse effect profile.  Patient is amenable to switch.  Prescription sent in.

## 2022-11-02 NOTE — Unmapped (Signed)
Patient called, just found out she is pregnant and is on Enbrel and wants to know if this is ok  [phone]

## 2022-11-03 DIAGNOSIS — M059 Rheumatoid arthritis with rheumatoid factor, unspecified: Principal | ICD-10-CM

## 2022-11-06 MED ORDER — EMPTY CONTAINER
2 refills | 0 days
Start: 2022-11-06 — End: ?

## 2022-11-06 NOTE — Unmapped (Signed)
[facility] Special[facility] Medication Onboarding    Specialty Medication: CIMZIA STARTER KIT 400 mg/2 mL (200 mg/mL x 2) Sykt injection syringe (certolizumab pegol)  Prior Authorization: Approved   Financial Assistance: No - copay  <$25  Final Copay/Day Supply: $4 / 34    Insurance Restrictions: Yes - max 1 month supply     Notes to Pharmacist: Maintenance dose $4 / 28 days  Credit Card on File: no    The triage team has completed the benefits investigation and has determined that the patient is able to fill this medication at [facility]. Please contact the patient to complete the onboarding or follow up with the prescribing physician as needed.

## 2022-11-06 NOTE — Unmapped (Signed)
[facility]   Patient Onboarding/Medication Counseling    Alicia Norris is a 22 y.o. female with RA who I am counseling today on initiation of therapy.  I am speaking to the patient. She is switching from Enbrel to Cimzia due to pregnancy.    Was a Nurse, learning disability used for this call? No    Verified patient's date of birth / HIPAA.    Specialty medication(s) to be sent: Inflammatory Disorders: Cimzia      Non-specialty medications/supplies to be sent:  sharps      Medications not needed at this time: n/a       Cimzia (certolizumab pegol)    Medication & Administration     Dosage: Rheumatoid arthritis: Inject 400mg  under the skin at weeks 0, 2, and 4, then 200mg  every 2 weeks      Lab tests required prior to treatment initiation:  Tuberculosis: Tuberculosis screening resulted in a non-reactive Quantiferon TB Gold assay.  Hepatitis B: Hepatitis B serology studies are complete and non-reactive.      Administration:     Artist all supplies needed for injection on a clean, flat working surface: medication syringe(s) removed from packaging, alcohol swabs, sharps container, etc.  Look at the medication label - look for correct medication, correct dose, and check the expiration date  Look at the medication - the liquid in the syringe should appear clear and colorless to yellow  Lay the syringe on a flat surface and allow it to warm up to room temperature for at least 30-60 minutes  Select injection site - you can use the front of your thigh or your belly (but not the area 2 inches around your belly button)  Prepare injection site - wash your hands and clean the skin at the injection site with an alcohol swab and let it air dry, do not touch the injection site again before the injection  Pull off the needle safety cap by holding the syringe with the needle pointing up in one hand and with your other hand remove the plastic ring needle cap by pulling straight up on the plastic ring; do not remove until immediately prior to injection  Pinch the skin - with your hand not holding the syringe pinch up a fold of skin at the injection site using your forefinger and thumb  Insert the needle into the fold of skin at about a 45 degree angle - it's best to use a quick dart-like motion - with the syringe in position, release the pinch of skin and allow the skin to relax  Push the plunger down slowly as far as it will go until the syringe is empty  Check that the syringe is completely empty and pull the needle out at the same angle as inserted  Dispose of the used syringe immediately in your sharps disposal container, do not attempt to recap the needle prior to disposing  If you see any blood at the injection site, press a cotton ball or gauze on the site and maintain pressure until the bleeding stops, do not rub the injection site      Adherence/Missed dose instructions:  If your injection is given more than 4 days after your scheduled injection date - consult your pharmacist for additional instructions on how to adjust your dosing schedule.      Goals of Therapy     Rheumatoid arthritis  Achieve symptom remission  Slow disease progression  Protection of remaining articular structures  Maintenance of function  Maintenance of effective psychosocial functioning    Side Effects & Monitoring Parameters     Injection site reaction (redness, irritation, inflammation localized to the site of administration)  Signs of a common cold - minor sore throat, runny or stuffy nose, etc.    The following side effects should be reported to the provider:  Signs of a hypersensitivity reaction - rash; hives; itching; red, swollen, blistered, or peeling skin; wheezing; tightness in the chest or throat; difficulty breathing, swallowing, or talking; swelling of the mouth, face, lips, tongue, or throat; etc.  Reduced immune function - report signs of infection such as fever; chills; body aches; very bad sore throat; ear or sinus pain; cough; more sputum or change in color of sputum; pain with passing urine; wound that will not heal, etc.  Also at a slightly higher risk of some malignancies (mainly skin and blood cancers) due to this reduced immune function.  In the case of signs of infection - the patient should hold the next dose of Cimzia?? and call your primary care provider to ensure adequate medical care.  Treatment may be resumed when infection is treated and patient is asymptomatic.  Signs of lupus like a rash on the cheeks or other body parts, skin photosensitivity, etc.  Any mood changes or thoughts of harming yourself      Warnings, Precautions, & Contraindications     Have your bloodwork checked as you have been told by your prescriber  Talk with your doctor if you are pregnant, planning to become pregnant, or breastfeeding  Discuss the possible need for holding your dose(s) of Cimzia?? when a planned procedure is scheduled with the prescriber as it may delay healing/recovery timeline       Drug/Food Interactions     Medication list reviewed in Epic. The patient was instructed to inform the care team before taking any new medications or supplements. No drug interactions identified.   If you have a latex allergy use caution when handling, the needle shield inside the removable cap of the Cimzia?? prefilled syringe contains a derivative of natural rubber latex  Talk with you prescriber or pharmacist before receiving any live vaccinations while taking this medication and after you stop taking it    Storage, Handling Precautions, & Disposal     Store this medication in the refrigerator.  Do not freeze  If needed, you may store at room temperature for up to 7 days  Store in original packaging, protected from light  Do not shake  Dispose of used syringes/pens in a sharps disposal container          Current Medications (including OTC/herbals), Comorbidities and Allergies     Current Outpatient Medications   Medication Sig Dispense Refill acetaminophen (TYLENOL) 500 MG tablet Take 2 tablets (1,000 mg total) by mouth every eight (8) hours. 30 tablet 0    certolizumab pegol (CIMZIA STARTER KIT) 400 mg/2 mL (200 mg/mL x 2) SyKt injection syringe Inject the contents of 2 syringes (400 mg total) under the skin every fourteen (14) days for 3 doses. 6 mL 0    certolizumab pegol (CIMZIA) 400 mg/2 mL (200 mg/mL x 2) SyKt injection syringe Inject the contents of 2 syringes (200 mg total) under the skin every fourteen (14) days. 6 mL 3    copper 380 square MM (PARAGARD T 380A) 380 square mm IUD intrauterine device 1 each by Intrauterine route once.      cyclobenzaprine (FLEXERIL) 5 MG tablet  Take 1 tablet (5 mg total) by mouth Three (3) times a day as needed for muscle spasms. 60 tablet 0    ENBREL SURECLICK 50 MG/ML (0.98 ML) SUBCUTANEOUS PEN Inject the contents of 1 pen (50 mg total) under the skin every seven (7) days. 4 mL 11    predniSONE (DELTASONE) 5 MG tablet TAKE 1 TABLET (5 MG TOTAL) BY MOUTH DAILY. 30 tablet 5     No current facility-administered medications for this visit.       No Known Allergies    Patient Active Problem List   Diagnosis    Seropositive rheumatoid arthritis (CMS-HCC)    Thrombosis    RUQ pain       Reviewed and up to date in Epic.    Appropriateness of Therapy     Acute infections noted within Epic:  No active infections  Patient reported infection: None    Is medication and dose appropriate based on diagnosis and infection status? Yes    Prescription has been clinically reviewed: Yes      Baseline Quality of Life Assessment      How many days over the past month did your RA  keep you from your normal activities? For example, brushing your teeth or getting up in the morning. Every day    Financial Information     Medication Assistance provided: Prior Authorization    Anticipated copay of $4 reviewed with patient. Verified delivery address.    Delivery Information     Scheduled delivery date: 6/27    Expected start date: 6/27      Medication will be delivered via UPS to the prescription address in [facility].  This shipment will not require a signature.      Explained the services we provide at [facility] and that each month we would call to set up refills.  Stressed importance of returning phone calls so that we could ensure they receive their medications in time each month.  Informed patient that we should be setting up refills 7-10 days prior to when they will run out of medication.  A pharmacist will reach out to perform a clinical assessment periodically.  Informed patient that a welcome packet, containing information about our pharmacy and other support services, a Notice of Privacy Practices, and a drug information handout will be sent.      The patient or caregiver noted above participated in the development of this care plan and knows that they can request review of or adjustments to the care plan at any time.      Patient or caregiver verbalized understanding of the above information as well as how to contact the pharmacy at [phone] option 4 with any questions/concerns.  The pharmacy is open Monday through Friday 8:30am-4:30pm.  A pharmacist is available 24/7 via pager to answer any clinical questions they may have.    Patient Specific Needs     Does the patient have any physical, cognitive, or cultural barriers? No    Does the patient have adequate living arrangements? (i.e. the ability to store and take their medication appropriately) Yes    Did you identify any home environmental safety or security hazards? No    Patient prefers to have medications discussed with  Patient     Is the patient or caregiver able to read and understand education materials at a high school level or above? Yes    Patient's primary language is  [place]  Is the patient high risk? Yes, pregnant/nursing patient. Contraindications have been assessed    SOCIAL DETERMINANTS OF HEALTH     At the [facility], we have learned that life circumstances - like trouble affording food, housing, utilities, or transportation can affect the health of many of our patients.   That is why we wanted to ask: are you currently experiencing any life circumstances that are negatively impacting your health and/or quality of life? Patient declined to answer    Social Determinants of Health     Financial Resource Strain: Not on file   Internet Connectivity: Not on file   Food Insecurity: Not on file   Tobacco Use: Low Risk  (06/25/2022)    Patient History     Smoking Tobacco Use: Never     Smokeless Tobacco Use: Never     Passive Exposure: Never   Recent Concern: Tobacco Use - High Risk (06/22/2022)    Received from [facility] visits prior to 07/15/2022.    Patient History     Smoking Tobacco Use: Every Day     Smokeless Tobacco Use: Never     Passive Exposure: Not on file   Housing/Utilities: Not on file   Alcohol Use: Not on file   Transportation Needs: Not on file   Substance Use: Not on file   Health Literacy: Not on file   Physical Activity: Not on file   Interpersonal Safety: Not on file   Stress: Not on file   Intimate Partner Violence: Not on file   Depression: Not at risk (03/18/2021)    Received from [facility] visits prior to 07/15/2022.    PHQ-2     SDOH PHQ2 SCORE: 2   Social Connections: Not on file       Would you be willing to receive help with any of the needs that you have identified today? Not applicable       Clydell Hakim, PharmD  [facility] Specialty Pharmacist

## 2022-11-08 MED FILL — EMPTY CONTAINER: 120 days supply | Qty: 1 | Fill #0

## 2022-11-09 NOTE — Unmapped (Signed)
[facility] Shared [facility] Clinical Intervention    Type of intervention: Medication administration    Medication involved: Cimzia    Problem identified: Patient is unsure about injection schedule.     Intervention performed: Confirmed patient received 6 syringes in recent shipment.  Reviewed that patient is to inject 2 syringes today (6/27). She will then use 2 syringes on 7/11 and then another 2 on 7/25.    After these doses, we will call her to check on how she is doing and schedule maintenance dose  refill to be delivered.  Maintenance dose will be 1 syringe q14 days.  Patient wrote down injection dates on the box and verified understanding.     Follow-up needed: N/A    Approximate time spent: 5-10 minutes    Clinical evidence used to support intervention: Chart review    Result of the intervention: Improved medication adherence    [name], PharmD   [facility] Specialty Pharmacist

## 2022-12-06 NOTE — Unmapped (Signed)
[facility] Clinical Assessment & Refill Coordination Note    Alicia Norris, [place]: 09/21/2000  Phone: [phone] (home)     All above HIPAA information was verified with patient.     Was a Nurse, learning disability used for this call? No    Specialty Medication(s):   Inflammatory Disorders: Cimzia     Current Outpatient Medications   Medication Sig Dispense Refill    acetaminophen (TYLENOL) 500 MG tablet Take 2 tablets (1,000 mg total) by mouth every eight (8) hours. 30 tablet 0    certolizumab pegol (CIMZIA STARTER KIT) 400 mg/2 mL (200 mg/mL x 2) SyKt injection syringe Inject the contents of 2 syringes (400 mg total) under the skin every fourteen (14) days for 3 doses. 6 mL 0    certolizumab pegol (CIMZIA) 400 mg/2 mL (200 mg/mL x 2) SyKt injection syringe Inject the contents of 1 syringes (200 mg total) under the skin every fourteen (14) days. 6 mL 3    copper 380 square MM (PARAGARD T 380A) 380 square mm IUD intrauterine device 1 each by Intrauterine route once.      cyclobenzaprine (FLEXERIL) 5 MG tablet Take 1 tablet (5 mg total) by mouth Three (3) times a day as needed for muscle spasms. 60 tablet 0    empty container (SHARPS-A-GATOR DISPOSAL SYSTEM) Misc Use as directed 1 each 2    ENBREL SURECLICK 50 MG/ML (0.98 ML) SUBCUTANEOUS PEN Inject the contents of 1 pen (50 mg total) under the skin every seven (7) days. 4 mL 11    predniSONE (DELTASONE) 5 MG tablet TAKE 1 TABLET (5 MG TOTAL) BY MOUTH DAILY. 30 tablet 5     No current facility-administered medications for this visit.        Changes to medications: Rever reports no changes at this time.    No Known Allergies    Changes to allergies: No    SPECIALTY MEDICATION ADHERENCE       Medication Adherence    Patient reported X missed doses in the last month: 0  Specialty Medication: Cimzia  Patient is on additional specialty medications: No  Informant: patient          Specialty medication(s) dose(s) confirmed: Regimen is correct and unchanged.     Are there any concerns with adherence? No    Adherence counseling provided? Not needed    CLINICAL MANAGEMENT AND INTERVENTION      Clinical Benefit Assessment:    Do you feel the medicine is effective or helping your condition? Yes.  Shoulders, elbows, wrists are all better    Clinical Benefit counseling provided? Not needed    Adverse Effects Assessment:    Are you experiencing any side effects? No    Are you experiencing difficulty administering your medicine? No    Quality of Life Assessment:    Quality of Life    Rheumatology  Oncology  Dermatology  Cystic Fibrosis          How many days over the past month did your RA  keep you from your normal activities? For example, brushing your teeth or getting up in the morning. It's constant. Ankle always hurts     Have you discussed this with your provider? Not needed    Acute Infection Status:    Acute infections noted within Epic:  No active infections  Patient reported infection: None    Therapy Appropriateness:    Is therapy appropriate and patient progressing towards therapeutic goals? Yes,  therapy is appropriate and should be continued    DISEASE/MEDICATION-SPECIFIC INFORMATION      For patients on injectable medications: Patient currently has 1 doses left.  Next injection is scheduled for 6/25.    Chronic Inflammatory Diseases: Have you experienced any flares in the last month? No    PATIENT SPECIFIC NEEDS     Does the patient have any physical, cognitive, or cultural barriers? No    Is the patient high risk? No    Did the patient require a clinical intervention? No    Does the patient require physician intervention or other additional services (i.e., nutrition, smoking cessation, social work)? No    SOCIAL DETERMINANTS OF HEALTH     At the [facility], we have learned that life circumstances - like trouble affording food, housing, utilities, or transportation can affect the health of many of our patients.   That is why we wanted to ask: are you currently experiencing any life circumstances that are negatively impacting your health and/or quality of life? No    Social Determinants of Health     Financial Resource Strain: Not on file   Internet Connectivity: Not on file   Food Insecurity: Not on file   Tobacco Use: Low Risk  (06/25/2022)    Patient History     Smoking Tobacco Use: Never     Smokeless Tobacco Use: Never     Passive Exposure: Never   Recent Concern: Tobacco Use - High Risk (06/22/2022)    Received from [facility] visits prior to 07/15/2022.    Patient History     Smoking Tobacco Use: Every Day     Smokeless Tobacco Use: Never     Passive Exposure: Not on file   Housing/Utilities: Not on file   Alcohol Use: Not on file   Transportation Needs: Not on file   Substance Use: Not on file   Health Literacy: Not on file   Physical Activity: Not on file   Interpersonal Safety: Unknown (12/06/2022)    Interpersonal Safety     Unsafe Where You Currently Live: Not on file     Physically Hurt by Anyone: Not on file     Abused by Anyone: Not on file   Stress: Not on file   Intimate Partner Violence: Not on file   Depression: Not at risk (03/18/2021)    Received from [facility] visits prior to 07/15/2022.    PHQ-2     SDOH PHQ2 SCORE: 2   Social Connections: Not on file       Would you be willing to receive help with any of the needs that you have identified today? No       SHIPPING     Specialty Medication(s) to be Shipped:   Inflammatory Disorders: Cimzia    Other medication(s) to be shipped: No additional medications requested for fill at this time     Changes to insurance: No    Delivery Scheduled: Yes, Expected medication delivery date: 7/31.     Medication will be delivered via UPS to the confirmed prescription address in [place].    The patient will receive a drug information handout for each medication shipped and additional FDA Medication Guides as required.  Verified that patient has previously received a Conservation officer, historic buildings and a Surveyor, mining.    The patient or caregiver noted above participated in the development of this care plan and knows that they can request  review of or adjustments to the care plan at any time.      All of the patient's questions and concerns have been addressed.    [name], PharmD   [facility] Specialty Pharmacist

## 2022-12-12 MED FILL — CIMZIA 400 MG/2 ML (200 MG/ML X 2) SUBCUTANEOUS SYRINGE KIT: SUBCUTANEOUS | 28 days supply | Qty: 2 | Fill #0

## 2023-01-09 NOTE — Unmapped (Signed)
[facility] Refill Coordination Note    Specialty Medication(s) to be Shipped:   Inflammatory Disorders: Cimzia    Other medication(s) to be shipped: No additional medications requested for fill at this time     [facility], DOB: 04-26-2001  Phone: [phone] (home)       All above HIPAA information was verified with patient.     Was a Nurse, learning disability used for this call? No    Completed refill call assessment today to schedule patient's medication shipment from the [facility] [phone]).  All relevant notes have been reviewed.     Specialty medication(s) and dose(s) confirmed: Regimen is correct and unchanged.   Changes to medications: Loleta reports no changes at this time.  Changes to insurance: No  New side effects reported not previously addressed with a pharmacist or physician: None reported  Questions for the pharmacist: No    Confirmed patient received a Conservation officer, historic buildings and a Surveyor, mining with first shipment. The patient will receive a drug information handout for each medication shipped and additional FDA Medication Guides as required.       DISEASE/MEDICATION-SPECIFIC INFORMATION        For patients on injectable medications: Patient currently has 0 doses left.  Next injection is scheduled for 01/18/23.    SPECIALTY MEDICATION ADHERENCE     Medication Adherence    Patient reported X missed doses in the last month: 0  Specialty Medication: CIMZIA 400 mg/2 mL (200 mg/mL x 2) Sykt injection syringe (certolizumab pegol)  Patient is on additional specialty medications: No  Additional Specialty Medications: EnbreL SureClick 50 mg/mL (1 mL) Pnij (etanercept)  Informant: patient              Were doses missed due to medication being on hold? No      CIMZIA 400 mg/2 mL (200 mg/mL x 2) Sykt injection syringe (certolizumab pegol): 0 doses of medicine on hand     REFERRAL TO PHARMACIST     Referral to the pharmacist: Not needed      [facility]     Shipping address confirmed in Epic.       Delivery Scheduled: Yes, Expected medication delivery date: 01/17/23.     Medication will be delivered via UPS to the prescription address in Epic [facility].    Craige Cotta   [facility] Specialty Technician

## 2023-01-16 MED FILL — CIMZIA 400 MG/2 ML (200 MG/ML X 2) SUBCUTANEOUS SYRINGE KIT: SUBCUTANEOUS | 28 days supply | Qty: 2 | Fill #1

## 2023-02-12 NOTE — Unmapped (Signed)
Patient was notified of operational disruptions. Patient opted to: schedule their refill with understanding of potential delay until 10/1 or later. This was facilitated by pharmacy staff     [facility] Special[facility] and [facility] Delivery Pharmacy Refill Coordination Note    Specialty Medication(s) to be Shipped:   Inflammatory Disorders: Cimzia    Other medication(s) to be shipped:  prednisone     [facility], DOB: July 21, 2000  Phone: [phone] (home)       All above HIPAA information was verified with patient.     Was a Nurse, learning disability used for this call? No    Completed refill call assessment today to schedule patient's medication shipment from the [facility] Delivery Pharmacy  [phone]).  All relevant notes have been reviewed.     Specialty medication(s) and dose(s) confirmed: Regimen is correct and unchanged.   Changes to medications: Rylee reports no changes at this time.  Changes to insurance: No  New side effects reported not previously addressed with a pharmacist or physician: Yes - Patient reports pain in right ankle. Patient would like to speak to the pharmacist today. Their provider is not aware.  Questions for the pharmacist: No    Confirmed patient received a Conservation officer, historic buildings and a Surveyor, mining with first shipment. The patient will receive a drug information handout for each medication shipped and additional FDA Medication Guides as required.       DISEASE/MEDICATION-SPECIFIC INFORMATION        For patients on injectable medications: Patient currently has 0 doses left.  Next injection is scheduled for 02/22/23.    SPECIALTY MEDICATION ADHERENCE     Medication Adherence    Patient reported X missed doses in the last month: 0  Specialty Medication: CIMZIA 400 mg/2 mL (200 mg/mL x 2) Sykt injection syringe (certolizumab pegol)  Patient is on additional specialty medications: No  Informant: patient              Were doses missed due to medication being on hold? No     CIMZIA 400 mg/2 mL (200 mg/mL x 2) Sykt injection syringe (certolizumab pegol): 0 days of medicine on hand       REFERRAL TO PHARMACIST     Referral to the pharmacist: Not needed      [facility]     Shipping address confirmed in Epic.       Delivery Scheduled: Yes, Expected medication delivery date: 02/20/23.     Medication will be delivered via UPS to the prescription address in Epic [facility].    Craige Cotta   [facility] and Home Delivery Pharmacy  Specialty Technician

## 2023-02-19 MED FILL — CIMZIA 400 MG/2 ML (200 MG/ML X 2) SUBCUTANEOUS SYRINGE KIT: SUBCUTANEOUS | 28 days supply | Qty: 2 | Fill #2

## 2023-02-19 MED FILL — PREDNISONE 5 MG TABLET: ORAL | 30 days supply | Qty: 30 | Fill #4

## 2023-03-15 NOTE — Unmapped (Signed)
[facility] Specialty and Home Delivery Pharmacy Refill Coordination Note    Specialty Medication(s) to be Shipped:   Inflammatory Disorders: Cimzia    Other medication(s) to be shipped:  prednisone     [facility], DOB: 18-Apr-2001  Phone: [phone] (home)       All above HIPAA information was verified with patient.     Was a Nurse, learning disability used for this call? No    Completed refill call assessment today to schedule patient's medication shipment from the [facility] and [facility] Delivery Pharmacy  [phone]).  All relevant notes have been reviewed.     Specialty medication(s) and dose(s) confirmed: Regimen is correct and unchanged.   Changes to medications: Tahnee reports no changes at this time.  Changes to insurance: No  New side effects reported not previously addressed with a pharmacist or physician: None reported  Questions for the pharmacist: No    Confirmed patient received a Conservation officer, historic buildings and a Surveyor, mining with first shipment. The patient will receive a drug information handout for each medication shipped and additional FDA Medication Guides as required.       DISEASE/MEDICATION-SPECIFIC INFORMATION        For patients on injectable medications: Patient currently has 1 doses left.  Next injection is scheduled for 10/31.    SPECIALTY MEDICATION ADHERENCE     Medication Adherence    Patient reported X missed doses in the last month: 0  Specialty Medication: Cimzia 200mg  q 14 days  Patient is on additional specialty medications: No  Informant: patient              Were doses missed due to medication being on hold? No    Cimzia 200 mg/ml: 1 doses of medicine on hand       REFERRAL TO PHARMACIST     Referral to the pharmacist: Not needed      [facility]     Shipping address confirmed in Epic.       Delivery Scheduled: Yes, Expected medication delivery date: 03/22/23.     Medication will be delivered via UPS to the prescription address in Epic [facility].    Darryl Nestle, PharmD   [facility] Special[facility] and Home Delivery Pharmacy  Specialty Pharmacist

## 2023-03-16 ENCOUNTER — Ambulatory Visit
Admit: 2023-03-16 | Discharge: 2023-03-17 | Payer: PRIVATE HEALTH INSURANCE | Attending: Student in an Organized Health Care Education/Training Program | Primary: Student in an Organized Health Care Education/Training Program

## 2023-03-16 DIAGNOSIS — M059 Rheumatoid arthritis with rheumatoid factor, unspecified: Principal | ICD-10-CM

## 2023-03-16 MED ADMIN — lidocaine (XYLOCAINE) 10 mg/mL (1 %) injection 2 mL: 2 mL | @ 20:00:00 | Stop: 2023-03-16

## 2023-03-16 MED ADMIN — methylPREDNISolone acetate (DEPO-Medrol) injection 80 mg: 80 mg | INTRA_ARTICULAR | @ 20:00:00 | Stop: 2023-03-16

## 2023-03-16 NOTE — Unmapped (Signed)
Rheumatology Clinic Follow-up Note     Assessment/Plan:    Alicia Norris is a 22 y.o.  female with a PMH of seropositive RA who presents today for follow up of RA.    Initially started on MTX with improvement but remained active despite 20mg  PO. Ultimately started on Enbrel 09/2021, she self-d/c'd MTX. Did well with Enbrel but due to pregnancy (~09/2022), switched to Cimzia monotherapy.    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 No systemic infectious symptoms and given the bilateral nature (albeit asymmetric in severity), low suspicion for infection at this time. As she is pregnant, we discussed options including a local injection vs. increasing prednisone (not ideal due to pregnancy). Ultimately she agreed with CS injection (see separate procedure note).    After pregnancy, if she remains active we will need to consider adjusting DMARD. Did not discuss breastfeeding plans at length today, if she will be breastfeeding then may switch biologic vs. Addition of HCQ. If not, then would likely plan to restart MTX.  - Cont Cimzia  - Pred 5mg   - Injection as outlined in separate procedure note    Update: Called patient 03/19/23 to see how she did after the injection. She reports feeling drastically better and was able to walk around her park for the first time in weeks/months. Swelling has subsided as well.    Swollen Joint Count (0-28): 0  Tender Joint Count (0-28): 0  Patient Global Assessment of Disease Activity (0-10): 4  Evaluator Global Assessment of Disease (0-10): 4  CDAI Score: 8  CDAI interpretation:  0.0-2.8 Remission   2.9-10.0 Low disease activity   10.1-22.0 Moderate disease activity   22.1-76.0 High disease activity       Return in about 5 months (around 08/14/2023).    [name], MD   Assistant Professor of Medicine  Department of Medicine/Division of Rheumatology  [facility]    Primary Care Provider: [facility]    HPI:  Alicia Norris is a 22 y.o.  female with a PMH of seropositive RA who presents today for follow up of RA.    Today  She is now ~21mo into pregnancy and was switched from Enbrel to Cimzia. Most joints are doing great today, although her R>>L ankle is bothering her. Has issues with ambulation due to this and has not been able to participate much in walking. Otherwise tolerating Cimzia well.    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 Re-presented 01/2021 and was [redacted]wks pregnant, was given pred monotherapy again. Started SSZ 02/2021, took for a few months but presented to [facility] ED on 05/2021 with flare, first saw [facility] 06/10/21 and started on MTX at that time. On MTX 20mg  PO she had significant improvement but remained active, Enbrel added ~09/2021.    Review of Systems:  Positive findings noted above, otherwise a 14 point review of systems was reviewed and negative    Past Medical, Surgical, Family and Social History reviewed and updated per EMR     Allergies:  Cat dander    Medications:     Current Outpatient Medications:     acetaminophen (TYLENOL) 500 MG tablet, Take 2 tablets (1,000 mg total) by mouth every eight (8) hours., Disp: 30 tablet, Rfl: 0  certolizumab pegol (CIMZIA) 400 mg/2 mL (200 mg/mL x 2) SyKt injection syringe, Inject the contents of 1 syringe (200 mg total) under the skin every fourteen (14) days., Disp: 6 mL, Rfl: 3    empty container (SHARPS-A-GATOR DISPOSAL SYSTEM) Misc, Use as directed, Disp: 1 each, Rfl: 2    predniSONE (DELTASONE) 5 MG tablet, TAKE 1 TABLET (5 MG TOTAL) BY MOUTH DAILY., Disp: 30 tablet, Rfl: 5    prenatal vit no.126-iron-folic 28 mg iron- 800 mcg Tab, Take 1 tablet by mouth daily., Disp: , Rfl:     copper 380 square MM (PARAGARD T 380A) 380 square mm IUD intrauterine device, 1 each by Intrauterine route once. (Patient not taking: Reported on 03/16/2023), Disp: , Rfl:     cyclobenzaprine (FLEXERIL) 5 MG tablet, Take 1 tablet (5 mg total) by mouth Three (3) times a day as needed for muscle spasms. (Patient not taking: Reported on 03/16/2023), Disp: 60 tablet, Rfl: 0      Objective   Vitals:    03/16/23 1454   BP: 100/64   Pulse: 111   Temp: 36.5 ??C (97.7 ??F)   TempSrc: Temporal   Weight: 86.6 kg (191 lb)             Physical Exam  General: well appearing, no acute distress  Eyes: EOMI, normal conjunctivae   ENT: MMM.  Oropharynx without any erythema or exudate.  No oral or nasal ulcers.  Neck: supple. No cervical lymphadenopathy  Cardiovascular: Regular rate and rhythm. No murmurs, rubs or gallops.   Pulmonary: Clear to auscultation bilaterally. Normal work of breathing.  Skin: No rash, lesions, breakdown. No purpura or petechiae. No digital ulcers.   Extremities: Warm and well perfused, no cyanosis, clubbing or edema  Musculoskeletal: R ankle swollen with severely impaired ROM and warmth. L with very slight fullness. No synovitis or tenderness to palpation in the hands, wrists, elbows, shoulders, knees, or feet. Full ROM throughout.   Neurologic: Cranial nerves grossly intact, strength 5/5 throughout.  Normal sensation  Psychiatric: Normal mood and affect.    I personally spent 31 minutes face-to-face and non-face-to-face in the care of this patient, which includes all pre, intra, and post visit time on the date of service.  All documented time was specific to the E/M visit and does not include any procedures that may have been performed.

## 2023-03-16 NOTE — Unmapped (Signed)
Patient information following joint aspiration/injection    What is an aspiration and/or injection?    Aspiration and injection refer to the placement of a needle into a joint cavity or bursa to remove fluid for analysis or study, and to inject medicine for pain relief or treatment of a disease (rheumatoid arthritis, gout, pseudogout, infectious arthritis, others). Sometimes the doctor may only remove some of the joint fluid to establish a diagnosis (or cause) for the collection of fluid. In other cases, such as in patients with chronic osteoarthritis, the doctor may only inject medicine to provide pain relief.    Are there risks with needle aspiration and injection?    The risks of this procedure are few. Introducing infection into the joint is uncommon, occurring in less than 0.01 percent of patients. Rarely, the needle tip may damage the cartilage surface inside the joint. The greatest risk of injury to the joint appears to be associated with placing too much medicine in the joint or administering the medicine too often.    Is the procedure painful?    Most people find the procedure tolerable. However, the procedure can hurt if the needle touches bone or an inflamed tissue. Your doctor will try to avoid these surfaces, but sometimes this cannot be prevented. If you feel discomfort, it will generally be brief. This procedure is most commonly performed without an anesthetic (numbing medicine). Pain occurring hours after an injection into a joint can develop from the crystals of medicine that are inserted and may be prevented by taking an anti-inflammatory medicine such as ibuprofen (brand names: Advil, Motrin, Nuprin).  Icing the area for 10 minutes every few hours after the procedure may also be helpful.    If I had a large amount of fluid removed from the joint, can it come back?    Yes. A collection of fluid within the joint or bursa is called an effusion. It is common for an effusion to recur after removal of a large amount of fluid. Some experts recommend that an elastic (ACE) wrap be placed over the joint after removal of large amounts of fluid.    If medicine is injected, how long will it be effective?    The response to medicine injected into the joint is highly variable. Some people get lasting relief, whereas others may only notice improvement for days to weeks. Call your doctor if the pain returns quickly.        Following Joint Aspiration and Injection    *A bandage was placed over the needle insertion site. You can remove it at any time.    *One complication of a joint injection is postinjection flare. This is a rare inflammatory reaction to the medicine placed in the joint. The reaction produces intense pain, beginning about 6 to 12 hours after the injection. Patients usually complain that their pain is worse than it was before the injection was performed. To blunt or eliminate this reaction, take ibuprofen, three 200-mg tablets three times a day, for at least the first two days after the injection.    *If a large amount of fluid was removed from the joint, you may be asked to wear an elastic (ACE) wrap over the site. The wrap is applied to create support and pressure that may reduce the tendency for the fluid to come back. Do not wrap the ACE wrap so tightly that the extremity becomes numb or looks blue.    *If laboratory studies were performed on the joint fluid,  the results should come back in the next few days. Your doctor's office will contact you with the results. Please keep any scheduled follow-up appointments with your doctor.    *Infection in the joint is uncommon after joint aspiration and injection. If the joint becomes red, warm, or tender, or if you develop a fever in the first few days after the procedure, please call your doctor.    *If your joint was injected with medicine and local anesthetic, it may be numb for several hours after the procedure. Avoid heavy exercise or placing excessive strain on the injected joint (e.g., carrying children or heavy objects) for the first two weeks after the procedure. Jumping up and down, for instance, can place a heavy load on an anesthetized joint and should be avoided.    *Joint pain may slowly return after an injection. Some people receive long-term pain relief, but for most people the pain gradually returns. Continue your long-term medicine for the joint as prescribed by your doctor.    During regular hours, for any problems directly related to the injection, please call [phone] and ask for Dr. Delton See.  For other issues, please contact your primary rheumatologist.    After hours or on weekends, please call [phone] and ask to be connected to the  on-call rheumatologist.

## 2023-03-19 NOTE — Unmapped (Signed)
PROCEDURE NOTE: After a time out and discussion of risks and benefits, including the option for continued conservative management, the patient gave verbal consent for a  right  ankle  injection. Risks discussed included infection (1:5000), pain, bleeding and damage to local structures. A time out was completed confirming patient identification, consent, and location of procedure. The  right  ankle  was identified and marked.  This area was prepped with betadine and ethyl chloride was used for topical anesthesia.   2 mL of 1% lidocaine along with  80 mg of depomedrol was injected into this site.  The patient tolerated the procedure well and there were no immediate complications. Patient advised on post-injection flare management and iatrogenic septic joint signs/symptoms requiring immediate medical attention.

## 2023-03-21 MED FILL — PREDNISONE 5 MG TABLET: ORAL | 30 days supply | Qty: 30 | Fill #5

## 2023-03-21 MED FILL — CIMZIA 400 MG/2 ML (200 MG/ML X 2) SUBCUTANEOUS SYRINGE KIT: SUBCUTANEOUS | 28 days supply | Qty: 2 | Fill #3

## 2023-04-30 ENCOUNTER — Other Ambulatory Visit (HOSPITAL_BASED_OUTPATIENT_CLINIC_OR_DEPARTMENT_OTHER): Payer: Self-pay | Admitting: Certified Nurse Midwife

## 2023-05-02 NOTE — Unmapped (Signed)
[facility] has been contacted in regards to their refill of Cimzia. At this time, they have declined refill due to  patient is unable to provide credit card at this time. Patient states they will follow up with [place] .

## 2023-05-11 ENCOUNTER — Inpatient Hospital Stay (HOSPITAL_COMMUNITY)
Admission: AD | Admit: 2023-05-11 | Discharge: 2023-05-12 | Disposition: A | Discharge status: Home / Self Care | Payer: Medicaid Other | Attending: Obstetrics & Gynecology | Admitting: Obstetrics & Gynecology

## 2023-05-11 ENCOUNTER — Encounter (HOSPITAL_COMMUNITY): Payer: Self-pay | Admitting: Obstetrics & Gynecology

## 2023-05-11 DIAGNOSIS — O23593 Infection of other part of genital tract in pregnancy, third trimester: Secondary | ICD-10-CM | POA: Diagnosis not present

## 2023-05-11 DIAGNOSIS — O98813 Other maternal infectious and parasitic diseases complicating pregnancy, third trimester: Secondary | ICD-10-CM | POA: Diagnosis not present

## 2023-05-11 DIAGNOSIS — B9689 Other specified bacterial agents as the cause of diseases classified elsewhere: Secondary | ICD-10-CM | POA: Diagnosis not present

## 2023-05-11 DIAGNOSIS — O4703 False labor before 37 completed weeks of gestation, third trimester: Secondary | ICD-10-CM | POA: Diagnosis present

## 2023-05-11 DIAGNOSIS — Z3A32 32 weeks gestation of pregnancy: Secondary | ICD-10-CM | POA: Diagnosis not present

## 2023-05-11 DIAGNOSIS — B3731 Acute candidiasis of vulva and vagina: Secondary | ICD-10-CM | POA: Insufficient documentation

## 2023-05-11 LAB — URINALYSIS, ROUTINE W REFLEX MICROSCOPIC
Bilirubin Urine: NEGATIVE
Glucose, UA: NEGATIVE mg/dL
Hgb urine dipstick: NEGATIVE
Ketones, ur: NEGATIVE mg/dL
Nitrite: NEGATIVE
Protein, ur: NEGATIVE mg/dL
Specific Gravity, Urine: 1.018 (ref 1.005–1.030)
pH: 6 (ref 5.0–8.0)

## 2023-05-11 LAB — WET PREP, GENITAL
Clue Cells Wet Prep HPF POC: NONE SEEN
Sperm: NONE SEEN
Trich, Wet Prep: NONE SEEN
WBC, Wet Prep HPF POC: >=10 — AB (ref <10)

## 2023-05-11 MED ORDER — FLUCONAZOLE 150 MG PO TABS: ORAL_TABLET | ORAL | 0 refills | Status: DC

## 2023-05-11 NOTE — MAU Note (Signed)
Theresa Todd is a 22 y.o. at 109w1d here in MAU reporting: intense back pain for the past 3 days. Pt states today she felt pressure and constant ctx. Pt states the ctx are 15 minutes apart. Pt denies LOF or VB. +FM   Onset of complaint: 3 days ago  Pain score: 2/10 in lower back  2/10 lower abdomen  Vitals:   05/11/23 2110  BP: 133/79  Pulse: (!) 101  Resp: 20  Temp: 97.9 F (36.6 C)  SpO2: 100%     FHT:144 Lab orders placed from triage:

## 2023-05-11 NOTE — MAU Provider Note (Incomplete Revision)
History     CSN: [id number]  Arrival date and time: 05/11/23 2038   Event Date/Time   First Provider Initiated Contact with Patient 05/11/2023  9:11 PM   Chief Complaint  Patient presents with   Contractions    HPI  Theresa Todd is a 22 y.o. [phone] at [redacted]w[redacted]d who presents to the MAU for preterm contractions. Started 2 days ago, have become more uncomfortable. States they have been happening every 15 min or so, but they have not eased up at all. Today, had more pressure and constant ctx prompting eval in MAU. No LOF, VB. Has good FM. Also more nauseous and had diarrhea over past two days. No sick contacts, new foods, recent travel.  Past Medical History:  Diagnosis Date   Anemia     Past Surgical History:  Procedure Laterality Date   ADENOIDECTOMY     COCHLEAR IMPLANT Right 11-02-14   TYMPANOSTOMY TUBE PLACEMENT      History reviewed. No pertinent family history.  Social History   Tobacco Use   Smoking status: Never   Smokeless tobacco: Never  Substance Use Topics   Alcohol use: No   Drug use: No    Allergies: No Known Allergies  Medications Prior to Admission  Medication Sig Dispense Refill Last Dose/Taking   certolizumab pegol (CIMZIA) 2 X 200 MG KIT Inject 200 mg into the skin.   Past Week   predniSONE (DELTASONE) 1 MG tablet Take 1 mg by mouth daily with breakfast.   Taking   Prenatal Vit-Fe Fumarate-FA (PRENATAL MULTIVITAMIN) TABS tablet Take 1 tablet by mouth daily at 12 noon.   05/11/2023   acetaminophen (TYLENOL) 325 MG tablet Take 2 tablets (650 mg total) by mouth every 6 (six) hours as needed. 30 tablet 0    gabapentin (NEURONTIN) 300 MG capsule 1 tab nightly and as needed for headache 60 capsule 3    lidocaine (LIDODERM) 5 % Place 1 patch onto the skin daily. Remove & Discard patch within 12 hours or as directed by MD 30 patch 0    naproxen (NAPROSYN) 500 MG tablet Take 1 tablet (500 mg total) by mouth 2 (two) times daily. 30 tablet 0    promethazine  (PHENERGAN) 12.5 MG tablet 1 tablet every 6 hours as needed for headache. 30 tablet 3     ROS reviewed and pertinent positives and negatives as documented in HPI.  Physical Exam   Blood pressure 133/79, pulse (!) 101, temperature 97.9 F (36.6 C), temperature source Oral, resp. rate 20, height 5\' 1"  (1.549 m), weight 88.5 kg, last menstrual period 06/17/2022, SpO2 100%.  Physical Exam Exam conducted with a chaperone present.  Constitutional:      General: She is not in acute distress.    Appearance: Normal appearance. She is not ill-appearing.  HENT:     Head: Normocephalic and atraumatic.  Cardiovascular:     Rate and Rhythm: Normal rate.  Pulmonary:     Effort: Pulmonary effort is normal.     Breath sounds: Normal breath sounds.  Abdominal:     Palpations: Abdomen is soft.     Tenderness: There is no abdominal tenderness. There is no guarding.  Musculoskeletal:        General: Normal range of motion.  Skin:    General: Skin is warm and dry.     Findings: No rash.  Neurological:     General: No focal deficit present.     Mental Status: She is alert and oriented  to person, place, and time.   Dilation: Closed (outer os 1cm) Effacement (%): Thick Cervical Position: Posterior Exam by:: Lucianne Muss, MD  EFM: 150/mod/+a/-d  MAU Course  Procedures  MDM 22 y.o. [id number] at [redacted]w[redacted]d presenting for preterm contractions. Ctx every 15 min, recently had yeast infx which was treated w a single dose of diflucan. Cx closed/thick on my evaluation. Collected wet prep, GC/CT, fFN collected, but not sent yet. Will watch on monitor, eval for ctx/re-eval dilation.   10:07 PM  Patient care handed off to [facility], CNM.  Sundra Aland, MD OB Fellow, Faculty Practice [facility], Center for [facility]   MDM:    No cervical change in 2+ hours in MAU, no evidence of active preterm labor. Rx for Diflucan sent to pharmacy.  Return precautions given.  Assessment and Plan   1.  Threatened preterm labor, third trimester   2. Vaginal candidiasis   3. [redacted] weeks gestation of pregnancy      D/C home  Sharen Counter, CNM 2:34 PM

## 2023-05-11 NOTE — MAU Provider Note (Cosign Needed)
History     CSN: [id number]  Arrival date and time: 05/11/23 2038   Event Date/Time   First Provider Initiated Contact with Patient 05/11/2023  9:11 PM   Chief Complaint  Patient presents with   Contractions    HPI  Theresa Todd is a 22 y.o. [phone] at [redacted]w[redacted]d who presents to the MAU for preterm contractions. Started 2 days ago, have become more uncomfortable. States they have been happening every 15 min or so, but they have not eased up at all. Today, had more pressure and constant ctx prompting eval in MAU. No LOF, VB. Has good FM. Also more nauseous and had diarrhea over past two days. No sick contacts, new foods, recent travel.  Past Medical History:  Diagnosis Date   Anemia     Past Surgical History:  Procedure Laterality Date   ADENOIDECTOMY     COCHLEAR IMPLANT Right 11-02-14   TYMPANOSTOMY TUBE PLACEMENT      History reviewed. No pertinent family history.  Social History   Tobacco Use   Smoking status: Never   Smokeless tobacco: Never  Substance Use Topics   Alcohol use: No   Drug use: No    Allergies: No Known Allergies  Medications Prior to Admission  Medication Sig Dispense Refill Last Dose/Taking   certolizumab pegol (CIMZIA) 2 X 200 MG KIT Inject 200 mg into the skin.   Past Week   predniSONE (DELTASONE) 1 MG tablet Take 1 mg by mouth daily with breakfast.   Taking   Prenatal Vit-Fe Fumarate-FA (PRENATAL MULTIVITAMIN) TABS tablet Take 1 tablet by mouth daily at 12 noon.   05/11/2023   acetaminophen (TYLENOL) 325 MG tablet Take 2 tablets (650 mg total) by mouth every 6 (six) hours as needed. 30 tablet 0    gabapentin (NEURONTIN) 300 MG capsule 1 tab nightly and as needed for headache 60 capsule 3    lidocaine (LIDODERM) 5 % Place 1 patch onto the skin daily. Remove & Discard patch within 12 hours or as directed by MD 30 patch 0    naproxen (NAPROSYN) 500 MG tablet Take 1 tablet (500 mg total) by mouth 2 (two) times daily. 30 tablet 0    promethazine  (PHENERGAN) 12.5 MG tablet 1 tablet every 6 hours as needed for headache. 30 tablet 3     ROS reviewed and pertinent positives and negatives as documented in HPI.  Physical Exam   Blood pressure 133/79, pulse (!) 101, temperature 97.9 F (36.6 C), temperature source Oral, resp. rate 20, height 5\' 1"  (1.549 m), weight 88.5 kg, last menstrual period 06/17/2022, SpO2 100%.  Physical Exam Exam conducted with a chaperone present.  Constitutional:      General: She is not in acute distress.    Appearance: Normal appearance. She is not ill-appearing.  HENT:     Head: Normocephalic and atraumatic.  Cardiovascular:     Rate and Rhythm: Normal rate.  Pulmonary:     Effort: Pulmonary effort is normal.     Breath sounds: Normal breath sounds.  Abdominal:     Palpations: Abdomen is soft.     Tenderness: There is no abdominal tenderness. There is no guarding.  Musculoskeletal:        General: Normal range of motion.  Skin:    General: Skin is warm and dry.     Findings: No rash.  Neurological:     General: No focal deficit present.     Mental Status: She is alert and oriented  to person, place, and time.   Dilation: Closed (outer os 1cm) Effacement (%): Thick Cervical Position: Posterior Exam by:: Lucianne Muss, MD  EFM: 150/mod/+a/-d  MAU Course  Procedures  MDM 22 y.o. [id number] at [redacted]w[redacted]d presenting for preterm contractions. Ctx every 15 min, recently had yeast infx which was treated w a single dose of diflucan. Cx closed/thick on my evaluation. Collected wet prep, GC/CT, fFN collected, but not sent yet. Will watch on monitor, eval for ctx/re-eval dilation.   10:07 PM  Patient care handed off to [facility], CNM.  Sundra Aland, MD OB Fellow, Faculty Practice [facility], Center for [facility]   MDM:    No cervical change in 2+ hours in MAU, no evidence of active preterm labor. Rx for Diflucan sent to pharmacy.  Return precautions given.  Assessment and Plan   1.  Threatened preterm labor, third trimester   2. Vaginal candidiasis   3. [redacted] weeks gestation of pregnancy      D/C home  Sharen Counter, CNM 2:34 PM

## 2023-05-13 LAB — CULTURE, OB URINE

## 2023-05-14 LAB — GC/CHLAMYDIA PROBE AMP (~~LOC~~) NOT AT ARMC
Chlamydia: NEGATIVE
Comment: NEGATIVE
Comment: NORMAL
Neisseria Gonorrhea: NEGATIVE

## 2023-05-18 MED ORDER — PREDNISONE 5 MG TABLET
ORAL_TABLET | Freq: Every day | ORAL | 5 refills | 30.00 days | Status: CN
Start: 2023-05-18 — End: ?

## 2023-05-18 NOTE — Unmapped (Signed)
[facility] Refill Coordination Note    Specialty Medication(s) to be Shipped:   Inflammatory Disorders: Cimzia    Other medication(s) to be shipped: No additional medications requested for fill at this time     [facility], DOB: 2001/04/25  Phone: [phone] (home)       All above HIPAA information was verified with patient.     Was a Nurse, learning disability used for this call? No    Completed refill call assessment today to schedule patient's medication shipment from the [facility]  [phone]).  All relevant notes have been reviewed.     Specialty medication(s) and dose(s) confirmed: Regimen is correct and unchanged.   Changes to medications: Aven reports no changes at this time.  Changes to insurance: No  New side effects reported not previously addressed with a pharmacist or physician: None reported  Questions for the pharmacist: No    Confirmed patient received a Conservation officer, historic buildings and a Surveyor, mining with first shipment. The patient will receive a drug information handout for each medication shipped and additional FDA Medication Guides as required.       DISEASE/MEDICATION-SPECIFIC INFORMATION        For patients on injectable medications: Patient currently has 0 doses left.  Next injection is scheduled for overdue will take on 1/7.    SPECIALTY MEDICATION ADHERENCE     Medication Adherence    Patient reported X missed doses in the last month: 1  Specialty Medication: CIMZIA 400 mg/2 mL (200 mg/mL x 2) Sykt injection syringe (certolizumab pegol)  Patient is on additional specialty medications: No              Were doses missed due to medication being on hold? No    Cimzia 400/2 mg/ml: 0 doses of medicine on hand        REFERRAL TO PHARMACIST     Referral to the pharmacist: Not needed      [facility]     Shipping address confirmed in Epic.       Delivery Scheduled: Yes, Expected medication delivery date: 05/22/23.     Medication will be delivered via UPS to the prescription address in Epic WAM.    Willette Pa   [facility]  Specialty Technician

## 2023-05-21 MED FILL — CIMZIA 400 MG/2 ML (200 MG/ML X 2) SUBCUTANEOUS SYRINGE KIT: SUBCUTANEOUS | 28 days supply | Qty: 2 | Fill #4

## 2023-06-18 ENCOUNTER — Inpatient Hospital Stay (HOSPITAL_COMMUNITY)
Admission: AD | Admit: 2023-06-18 | Discharge: 2023-06-18 | Disposition: A | Discharge status: Home / Self Care | Payer: Medicaid Other | Attending: Obstetrics and Gynecology | Admitting: Obstetrics and Gynecology

## 2023-06-18 ENCOUNTER — Encounter (HOSPITAL_COMMUNITY): Payer: Self-pay | Admitting: Obstetrics and Gynecology

## 2023-06-18 DIAGNOSIS — Z1152 Encounter for screening for COVID-19: Secondary | ICD-10-CM | POA: Insufficient documentation

## 2023-06-18 DIAGNOSIS — L299 Pruritus, unspecified: Secondary | ICD-10-CM | POA: Diagnosis not present

## 2023-06-18 DIAGNOSIS — Z3A37 37 weeks gestation of pregnancy: Secondary | ICD-10-CM | POA: Insufficient documentation

## 2023-06-18 DIAGNOSIS — O26893 Other specified pregnancy related conditions, third trimester: Secondary | ICD-10-CM | POA: Diagnosis not present

## 2023-06-18 DIAGNOSIS — O99013 Anemia complicating pregnancy, third trimester: Secondary | ICD-10-CM | POA: Diagnosis present

## 2023-06-18 DIAGNOSIS — Z6837 Body mass index (BMI) 37.0-37.9, adult: Secondary | ICD-10-CM

## 2023-06-18 DIAGNOSIS — R059 Cough, unspecified: Secondary | ICD-10-CM | POA: Diagnosis not present

## 2023-06-18 LAB — WET PREP, GENITAL
Clue Cells Wet Prep HPF POC: NONE SEEN
Sperm: NONE SEEN
Trich, Wet Prep: NONE SEEN
WBC, Wet Prep HPF POC: >=10 — AB (ref <10)
Yeast Wet Prep HPF POC: NONE SEEN

## 2023-06-18 LAB — COMPREHENSIVE METABOLIC PANEL
ALT: 22 U/L (ref 0–44)
AST: 26 U/L (ref 15–41)
Albumin: 2.3 g/dL — ABNORMAL LOW (ref 3.5–5.0)
Alkaline Phosphatase: 158 U/L — ABNORMAL HIGH (ref 38–126)
Anion gap: 8 (ref 5–15)
BUN: <5 mg/dL — ABNORMAL LOW (ref 6–20)
CO2: 21 mmol/L — ABNORMAL LOW (ref 22–32)
Calcium: 9 mg/dL (ref 8.9–10.3)
Chloride: 106 mmol/L (ref 98–111)
Creatinine, Ser: 0.51 mg/dL (ref 0.44–1.00)
GFR, Estimated: >60 mL/min (ref >60)
Glucose, Bld: 87 mg/dL (ref 70–99)
Potassium: 3.5 mmol/L (ref 3.5–5.1)
Sodium: 135 mmol/L (ref 135–145)
Total Bilirubin: 0.5 mg/dL (ref 0.0–1.2)
Total Protein: 5.7 g/dL — ABNORMAL LOW (ref 6.5–8.1)

## 2023-06-18 LAB — CBC
HCT: 27.8 % — ABNORMAL LOW (ref 36.0–46.0)
Hemoglobin: 8.9 g/dL — ABNORMAL LOW (ref 12.0–15.0)
MCH: 24.5 pg — ABNORMAL LOW (ref 26.0–34.0)
MCHC: 32 g/dL (ref 30.0–36.0)
MCV: 76.4 fL — ABNORMAL LOW (ref 80.0–100.0)
Platelets: 249 10*3/uL (ref 150–400)
RBC: 3.64 MIL/uL — ABNORMAL LOW (ref 3.87–5.11)
RDW: 14.5 % (ref 11.5–15.5)
WBC: 8 10*3/uL (ref 4.0–10.5)
nRBC: 0 % (ref 0.0–0.2)

## 2023-06-18 LAB — RESP PANEL BY RT-PCR (RSV, FLU A&B, COVID)  RVPGX2
Influenza A by PCR: NEGATIVE
Influenza B by PCR: NEGATIVE
Resp Syncytial Virus by PCR: NEGATIVE
SARS Coronavirus 2 by RT PCR: NEGATIVE

## 2023-06-18 LAB — URINALYSIS, ROUTINE W REFLEX MICROSCOPIC
Bacteria, UA: NONE SEEN
Bilirubin Urine: NEGATIVE
Glucose, UA: NEGATIVE mg/dL
Hgb urine dipstick: NEGATIVE
Ketones, ur: NEGATIVE mg/dL
Nitrite: NEGATIVE
Protein, ur: 30 mg/dL — AB
Specific Gravity, Urine: 1.02 (ref 1.005–1.030)
pH: 6 (ref 5.0–8.0)

## 2023-06-18 MED ORDER — DEXTROMETHORPHAN POLISTIREX ER 30 MG/5ML PO SUER
30.0000 mg | Freq: Once | ORAL | Status: AC
  Administered 2023-06-18: 30 mg via ORAL
  Filled 2023-06-18: qty 5

## 2023-06-18 MED ORDER — FERROUS SULFATE 325 (65 FE) MG PO TABS: 325.0000 mg | ORAL_TABLET | ORAL | 0 refills | Status: AC

## 2023-06-18 NOTE — MAU Note (Signed)
Theresa Todd is a 23 y.o. at [redacted]w[redacted]d here in MAU reporting: has itching everywhere, is top to bottom.started 2 wks ago, getting worse. Started coughing(non-productive) and vomiting today.  Not related. Has been feeling dizzy and legs are weak.  All started today.  Is having vaginal pain and bleeding (pinkish mucous). Pain is a cramping, starts in her back, comes around to abd and down into vagina, started a few hours ago. Doesn't think contractions. No water leaking. No recent exams.  Reports diarrhea, started 2 days go, frequent/loose.  Onset of complaint: today,  Pain score: 3 Vitals:   06/18/23 1626  BP: 113/65  Pulse: (!) 110  Resp: 18  Temp: 98.4 F (36.9 C)  SpO2: 100%     WUJ:WJXBJ on, reports +FM Lab orders placed from triage:    Goes to clinic in [place], recently moved here and plans to deliver here.  3prior term vag deliveries.  Uncomplicated.

## 2023-06-18 NOTE — MAU Provider Note (Signed)
Chief Complaint:  Pruritis, Abdominal Pain, Back Pain, Vaginal Bleeding, and Cough   HPI   Event Date/Time   First Provider Initiated Contact with Patient 06/18/23 1645    Theresa Todd is a 23 y.o. [phone] at [redacted]w[redacted]d who presents to maternity admissions reporting multiple complaints including sever itching all over her body with no appearnce of a rash. Also newly developed today N/V/ and a couph. Denies any VB, LOF and no ctx. Reports some vaginal discharge that has some mucoid discharge associated with it. Care is with Normal but she reports that she is moving to the [place] area and would like to deliver here at [facility]. She reported that she spoke with them today and was told her Bile Acids were NM and she should take benadryl. 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 Currently not taking every day since on injections   Past Month   Prenatal Vit-Fe Fumarate-FA (PRENATAL MULTIVITAMIN) TABS tablet Take 1 tablet by mouth daily at 12 noon.   06/18/2023   acetaminophen (TYLENOL) 325 MG tablet Take 2 tablets (650 mg total) by mouth every 6 (six) hours as needed. 30 tablet 0 More than a month   fluconazole (DIFLUCAN) 150 MG tablet Take one tablet today and one tablet in three days 2 tablet 0    lidocaine (LIDODERM) 5 % Place 1 patch onto the skin daily. Remove & Discard patch within 12 hours or as directed by MD 30 patch 0    promethazine (PHENERGAN) 12.5 MG tablet 1 tablet every 6 hours as needed for headache. 30 tablet 3     I have reviewed patient's Past Medical Hx, Surgical Hx, Family Hx, Social Hx, medications and allergies.   ROS  Pertinent items noted in HPI and remainder of comprehensive ROS otherwise negative.   PHYSICAL EXAM  Patient Vitals for the past 24 hrs:  BP Temp  Temp src Pulse Resp SpO2 Height Weight  06/18/23 1704 93/73 -- -- (!) 111 -- -- -- --  06/18/23 1700 -- -- -- -- -- 98 % -- --  06/18/23 1626 113/65 98.4 F (36.9 C) Oral (!) 110 18 100 % 5\' 1"  (1.549 m) 91.2 kg    Constitutional: Well-developed, obese female in no acute distress.  Cardiovascular: normal rate & rhythm, warm and well-perfused Respiratory: normal effort, cough present , non productive  Lungs:  BCTA  GI: Abd soft, non-tender, Gravid (limited exam d/t increased body habitus) no RUQ pain illicited on palpation, no guarding, no rebound, no rigidity  MS: Extremities nontender, no edema, normal ROM Neurologic: Alert and oriented x 4.  GU: no CVA tenderness Pelvic: Pt declined ( Self Swabs ordered)     Fetal Tracing: Baseline: 140-145 Variability:moderate  Accelerations: present Decelerations:absent Toco: No CTX   Labs: Results for orders placed or performed during the hospital encounter of 06/18/23 (from the past 24 hours)  CBC      Status: Abnormal   Collection Time: 06/18/23  5:21 PM  Result Value Ref Range   WBC 8.0 4.0 - 10.5 K/uL   RBC 3.64 (L) 3.87 - 5.11 MIL/uL   Hemoglobin 8.9 (L) 12.0 - 15.0 g/dL   HCT 40.9 (L) 81.1 - 91.4 %   MCV 76.4 (L) 80.0 - 100.0 fL   MCH 24.5 (L) 26.0 - 34.0 pg   MCHC 32.0 30.0 - 36.0 g/dL   RDW 78.2 95.6 - 21.3 %   Platelets 249 150 - 400 K/uL   nRBC 0.0 0.0 - 0.2 %  Comprehensive metabolic panel     Status: Abnormal   Collection Time: 06/18/23  5:21 PM  Result Value Ref Range   Sodium 135 135 - 145 mmol/L   Potassium 3.5 3.5 - 5.1 mmol/L   Chloride 106 98 - 111 mmol/L   CO2 21 (L) 22 - 32 mmol/L   Glucose, Bld 87 70 - 99 mg/dL   BUN <5 (L) 6 - 20 mg/dL   Creatinine, Ser 0.86 0.44 - 1.00 mg/dL   Calcium 9.0 8.9 - 57.8 mg/dL   Total Protein 5.7 (L) 6.5 - 8.1 g/dL   Albumin 2.3 (L) 3.5 - 5.0 g/dL   AST 26 15 - 41 U/L   ALT 22 0 - 44 U/L   Alkaline Phosphatase 158 (H) 38 - 126 U/L   Total Bilirubin 0.5 0.0 - 1.2 mg/dL   GFR, Estimated >46 >96 mL/min   Anion gap 8 5 - 15  Urinalysis, Routine w reflex microscopic -Urine, Random     Status: Abnormal   Collection Time: 06/18/23  5:37 PM  Result Value Ref Range   Color, Urine AMBER (A) YELLOW   APPearance HAZY (A) CLEAR   Specific Gravity, Urine 1.020 1.005 - 1.030   pH 6.0 5.0 - 8.0   Glucose, UA NEGATIVE NEGATIVE mg/dL   Hgb urine dipstick NEGATIVE NEGATIVE   Bilirubin Urine NEGATIVE NEGATIVE   Ketones, ur NEGATIVE NEGATIVE mg/dL   Protein, ur 30 (A) NEGATIVE mg/dL   Nitrite NEGATIVE NEGATIVE   Leukocytes,Ua TRACE (A) NEGATIVE   RBC / HPF 0-5 0 - 5 RBC/hpf   WBC, UA 6-10 0 - 5 WBC/hpf   Bacteria, UA NONE SEEN NONE SEEN   Squamous Epithelial / HPF 21-50 0 - 5 /HPF   Mucus PRESENT   Resp panel by RT-PCR (RSV, Flu A&B, Covid) Anterior Nasal Swab     Status: None  Collection Time: 06/18/23  6:16 PM   Specimen: Anterior Nasal Swab  Result Value Ref Range   SARS Coronavirus 2 by RT PCR NEGATIVE NEGATIVE   Influenza A  by PCR NEGATIVE NEGATIVE   Influenza B by PCR NEGATIVE NEGATIVE   Resp Syncytial Virus by PCR NEGATIVE NEGATIVE  Wet prep, genital     Status: Abnormal   Collection Time: 06/18/23  6:16 PM   Specimen: PATH Cytology Cervicovaginal Ancillary Only  Result Value Ref Range   Yeast Wet Prep HPF POC NONE SEEN NONE SEEN   Trich, Wet Prep NONE SEEN NONE SEEN   Clue Cells Wet Prep HPF POC NONE SEEN NONE SEEN   WBC, Wet Prep HPF POC >=10 (A) <10   Sperm NONE SEEN     Imaging:  No results found.  MDM & MAU COURSE  MDM:  HIGH   Labs ordered NST Bile Acids- Send out  Respiratory Panel Labs c/w Anemia  No acute pathology at this time  No evidence of Cholestasis of pregnancy ( Bile Acids will be followed by primary OB)   I have reviewed the patient chart and performed the physical exam . I have ordered & interpreted the lab results and reviewed and interpreted the results  Medications ordered as stated below.  A/P as described below.  Counseling and education provided and patient agreeable  with plan as described below. Verbalized understanding.     MAU Course: Orders Placed This Encounter  Procedures   Resp panel by RT-PCR (RSV, Flu A&B, Covid) Anterior Nasal Swab   Wet prep, genital   CBC   Comprehensive metabolic panel   Urinalysis, Routine w reflex microscopic -Urine, Random   Bile acids, total   Airborne and Contact precautions   Discharge patient Discharge disposition: 01-Home or Self Care; Discharge patient date: 06/18/2023   Meds ordered this encounter  Medications   dextromethorphan (DELSYM) 30 MG/5ML liquid 30 mg   ferrous sulfate 325 (65 FE) MG tablet    Sig: Take 1 tablet (325 mg total) by mouth every other day.    Dispense:  15 tablet    Refill:  0    Supervising Provider:   Reva Bores [[id number]]    ASSESSMENT   1. Anemia affecting pregnancy in third trimester   2. [redacted] weeks gestation of pregnancy   3. BMI 37.0-37.9, adult   4. Pruritus   5. Cough, unspecified  type     PLAN  Discharge home in stable condition with return precautions.      Allergies as of 06/18/2023   No Known Allergies      Medication List     STOP taking these medications    fluconazole 150 MG tablet Commonly known as: DIFLUCAN   lidocaine 5 % Commonly known as: Lidoderm       TAKE these medications    acetaminophen 325 MG tablet Commonly known as: Tylenol Take 2 tablets (650 mg total) by mouth every 6 (six) hours as needed.   ferrous sulfate 325 (65 FE) MG tablet Take 1 tablet (325 mg total) by mouth every other day.   predniSONE 1 MG tablet Commonly known as: DELTASONE Take 5 mg by mouth as needed. Currently not taking every day since on injections   prenatal multivitamin Tabs tablet Take 1 tablet by mouth daily at 12 noon.   promethazine 12.5 MG tablet Commonly known as: PHENERGAN 1 tablet every 6 hours as needed for headache.      Marcell Barlow,  MSN, [facility], Center for [employer]

## 2023-06-18 NOTE — Discharge Instructions (Signed)
[facility] [employer] for [employer] at [employer] for Women    Phone: [phone]  Center for [employer] at [place]   Phone: [phone]  [facility] for [employer] at [place]  Phone: [phone]  [facility] for [employer] at [employer]  Phone: [phone]  [facility] for [employer] at [place]  Phone: [phone]  [facility] at [facility]   Phone: [phone]  [facility]       Phone: [phone]  [facility]    Phone: [phone]   [facility]    Phone: [phone]  [facility]    Phone: [phone]  [facility]    Phone: [phone]  [facility]-Family Planning       Phone: [phone]   [facility] Health Department-Maternity  Phone: [phone]  [facility]    Phone: [phone]  [facility] [place]   Phone: [phone]  [facility]      Phone: [phone]  [facility]    Phone: [phone]   We highly recommend childbirth education to help you plan for labor and begin practicing coping skills (which will be needed with or without pain meds).  [place] Childbirth Education Options: Sign up by visiting [email or web address]  Childbirth ~ Self-Paced eClass (English and Spanish) This online class offers you the freedom to complete a childbirth education series in the comfort of your own home at your own pace.  Childbirth Class (In-Person 4-Week Series  or on Saturdays, Virtual 4-Week Series ~ [place]) This interactive in-person class series will help you and your partner prepare for your birth experience. Topics include: Labor & Birth, Comfort Measures, Breathing Techniques, Massage, Medical Interventions, Pain Management Options, Cesarean Birth, Postpartum Care, and Newborn  Care  Comfort Techniques for Labor ~ In-Person Class [facility]) This interactive class is designed for parents-to-be who want to learn & practice hands-on skills to help relieve some of the discomfort of labor and encourage their babies to rotate toward the best position for birth. Moms and their partners will be able to try a variety of labor positions with birth balls and rebozos as well as practice breathing, relaxation, and visualization techniques.  Natural Childbirth Class (In-Person 5-Week Series, In-Person on Saturdays or Virtual 5-Week Series ~ [place]) This class series is designed for expectant parents who want to learn and practice natural methods of coping with the process of labor and childbirth.  Cesarean Birth Self-Paced eClass (English and Spanish) This online course provides comprehensive information you can trust as you prepare for a possible cesarean birth. In this class, you'll learn how to make your birth and recovery comfortable and joyful through instructive video clips, animations, and activities.  Waterbirth ~ Airline pilot Interested in a waterbirth? In addition to a consultation with your credentialed waterbirth provider, this free, informational online class will help you discover whether waterbirth is the right fit for you. Not all obstetrical practices offer waterbirth, so check with your healthcare provider.  Tour Probation officer) - [facility] [employer] our 4 minute video tour of [facility] located in [place].    Parenting Education Options:  Pregnancy 101 (Virtual) Congratulations on your pregnancy! This class is geared toward moms in their first trimester, but everyone is welcome. We are excited to guide you through all aspects of supporting a healthy pregnancy. You will learn what to expect  at routine prenatal care appointments, common postpartum adjustments, basic infant safety, and  breastfeeding.  Successful Partnering & Parenting ~ In-Person Workshop [facility]) This workshop inspires and equips partners of all economic levels, ages, and cultures to confidently care for their infants, support the birthing persons, and navigate their own transformations into new partners and parents. Learning activities are geared towards supporting partner, but moms are welcome to attend.  'Baby & Me' Parenting Group (Virtual on Wednesdays at 11am) Enjoy this time discussing newborn & infant parenting topics and family adjustment issues with other new parents in a relaxed environment. Each week brings a new speaker or baby-centered activity. This group offers support and connection to parents as they journey through the adjustments and struggles of that sometimes overwhelming first year after the birth of a child.  Baby Safety, CPR, & Choking Class ~ Virtual This life-saving information is meant to encourage parents as they learn important safety and prevention tips as well as infant CPR and relief of choking.  Breastfeeding Class (In-Person in [place] or [employer]) Families learn what to expect in the first days and weeks of breastfeeding your newborn. IF YOU ARE AN EMPLOYEE TAKING THIS CLASS FOR CREDIT, DO NOT register yourself. Please e-mail [email or web address].   Breastfeeding Self-Paced eClass (English & Spanish) Families learn what to expect in the first days and weeks of breastfeeding your newborn.  Caring for Baby ~ In-Person, Virtual or Self-Paced Class This in-person class is for both expectant and adoptive parents who want to learn and practice the most up-to-date newborn care for their babies. Focus is on birth through the first six weeks of life.  CPR & Choking Relief for Infants & Children ~ In-Person Class [facility]) This in-person course is designed for any parent, expectant parent, or adult who cares for infants or children. Participants learn and demonstrate  cardiopulmonary resuscitation and choking relief procedures for both infants and children.  Grandparent Love ~ In-Person Class Grandparents will learn the most updated infant care and safety recommendations. They will discover ways to support their own children during the transition into the parenting role and receive tips on communicating with the new parents.   Parenting Support Group Options:  Bereavement Grief Support Group (Pregnancy/Infant Loss) - Virtual This is an ongoing experience that meets once a month and is designed to help you honor the past, assist you in discovering tools to strengthen you today, and aid you in developing hope for the future.  Breastfeeding & Pumping Support Group (In-Person on Thursdays at 12pm or Virtual on Tuesdays at 5pm) Join [place] in-person each Thursday starting June 1st, 2023 at 12pm! This support group is free for all families looking for breastfeeding and/or pumping support.   Community-Based Childbirth Education Options:  [facility] Department Classes:  Childbirth education classes can help you get ready for a positive parenting experience. You can also meet other expectant parents and get free stuff for your baby. Each class runs for five weeks on the same night and costs $45 for the mother-to-be and her support person. Medicaid covers the cost if you are eligible. Call [phone] to register.  [facility] offers a variety of programs for the [employer] and is another great way to get connected. Please go to [email or web address] for more information.  Childbirth With A Twist! Be informed of your options, get educated on birth, understand what your body is doing, learn how to cope, and have a lot of fun and laughs all while doing  it either from the comfort of your couch OR in our cozy office and classroom space near the [place] airport. If you are taking a virtual class, then class is taught  LIVE, so you can ask questions and receive answers in real-time from an experienced doula and childbirth educator.  This virtual childbirth education class will meet for five instruction times online.  Although we are based in [place], [place], this virtual class is open to anyone in the world. Please visit: [email or web address] for more information.  Books We Love: The Doula Guide to Childbirth by Harland German and Otila Back The First-Time Parent's Childbirth Handbook by Dr. Amie Critchley, CNM The Birth Partner by Truddie Crumble

## 2023-06-19 ENCOUNTER — Encounter: Payer: Self-pay | Admitting: Obstetrics and Gynecology

## 2023-06-19 LAB — BILE ACIDS, TOTAL: Bile Acids Total: 8.1 umol/L (ref 0.0–10.0)

## 2023-06-19 LAB — GC/CHLAMYDIA PROBE AMP (~~LOC~~) NOT AT ARMC
Chlamydia: NEGATIVE
Comment: NEGATIVE
Comment: NORMAL
Neisseria Gonorrhea: NEGATIVE

## 2023-06-26 NOTE — Unmapped (Signed)
[facility] and Home Delivery Pharmacy Refill Coordination Note    Specialty Medication(s) to be Shipped:   Inflammatory Disorders: Cimzia    Other medication(s) to be shipped: No additional medications requested for fill at this time     [facility], DOB: [birth year]  Phone: [phone] (home)       All above HIPAA information was verified with patient.     Was a Nurse, learning disability used for this call? No    Completed refill call assessment today to schedule patient's medication shipment from the [facility]  [phone]).  All relevant notes have been reviewed.     Specialty medication(s) and dose(s) confirmed: Regimen is correct and unchanged.   Changes to medications: Alicia Norris reports no changes at this time.  Changes to insurance: No  New side effects reported not previously addressed with a pharmacist or physician: None reported  Questions for the pharmacist: No    Confirmed patient received a Conservation officer, historic buildings and a Surveyor, mining with first shipment. The patient will receive a drug information handout for each medication shipped and additional FDA Medication Guides as required.       DISEASE/MEDICATION-SPECIFIC INFORMATION        For patients on injectable medications: Patient currently has 0 doses left.  Next injection is scheduled for 06/29/23.    SPECIALTY MEDICATION ADHERENCE     Medication Adherence    Patient reported X missed doses in the last month: 0  Specialty Medication: CIMZIA 400 mg/2 mL (200 mg/mL x 2) Sykt injection syringe (certolizumab pegol)  Patient is on additional specialty medications: No  Patient is on more than two specialty medications: No  Any gaps in refill history greater than 2 weeks in the last 3 months: no  Demonstrates understanding of importance of adherence: yes              Were doses missed due to medication being on hold? No    CIMZIA 400  mg/ml: 0 days of medicine on hand       REFERRAL TO PHARMACIST     Referral to the pharmacist: Not needed      [facility]     Shipping address confirmed in Epic.       Delivery Scheduled: Yes, Expected medication delivery date: 06/28/23.     Medication will be delivered via UPS to the prescription address in Epic WAM.    Alicia Norris   [facility]  Specialty Technician

## 2023-06-27 MED FILL — CIMZIA 400 MG/2 ML (200 MG/ML X 2) SUBCUTANEOUS SYRINGE KIT: SUBCUTANEOUS | 28 days supply | Qty: 2 | Fill #5

## 2023-07-27 NOTE — Unmapped (Signed)
 [facility] Specialty and Home Delivery Pharmacy Clinical Assessment & Refill Coordination Note      Delivered baby 06/21/23      [facility], [place]: Feb 17, 2001  Phone: [phone] (home)     All above HIPAA information was verified with patient.     Was a Nurse, learning disability used for this call? No    Specialty Medication(s):   Inflammatory Disorders: Cimzia     Current Outpatient Medications   Medication Sig Dispense Refill    acetaminophen (TYLENOL) 500 MG tablet Take 2 tablets (1,000 mg total) by mouth every eight (8) hours. 30 tablet 0    certolizumab pegol (CIMZIA) 400 mg/2 mL (200 mg/mL x 2) SyKt injection syringe Inject the contents of 1 syringe (200 mg total) under the skin every fourteen (14) days. 6 mL 3    copper 380 square MM (PARAGARD T 380A) 380 square mm IUD intrauterine device 1 each by Intrauterine route once. (Patient not taking: Reported on 03/16/2023)      cyclobenzaprine (FLEXERIL) 5 MG tablet Take 1 tablet (5 mg total) by mouth Three (3) times a day as needed for muscle spasms. (Patient not taking: Reported on 03/16/2023) 60 tablet 0    empty container (SHARPS-A-GATOR DISPOSAL SYSTEM) Misc Use as directed 1 each 2    predniSONE (DELTASONE) 5 MG tablet TAKE 1 TABLET (5 MG TOTAL) BY MOUTH DAILY. 30 tablet 5    prenatal vit no.126-iron-folic 28 mg iron- 800 mcg Tab Take 1 tablet by mouth daily.       No current facility-administered medications for this visit.        Changes to medications: Sathvika reports no changes at this time.    Medication list has been reviewed and updated in Epic: Yes    Allergies   Allergen Reactions    Cat Dander Itching, Shortness Of Breath and Swelling     Other Reaction(s): Cough, Fatique       Changes to allergies: No    Allergies have been reviewed and updated in Epic: Yes    SPECIALTY MEDICATION ADHERENCE     Medication Adherence    Patient reported X missed doses in the last month: 0  Specialty Medication: Cimzia  Patient is on additional specialty medications: No  Informant: patient          Specialty medication(s) dose(s) confirmed: Regimen is correct and unchanged.     Are there any concerns with adherence? No    Adherence counseling provided? Not needed    CLINICAL MANAGEMENT AND INTERVENTION      Clinical Benefit Assessment:    Do you feel the medicine is effective or helping your condition? Yes    Clinical Benefit counseling provided? Not needed    Adverse Effects Assessment:    Are you experiencing any side effects? No    Are you experiencing difficulty administering your medicine? No    Quality of Life Assessment:    Quality of Life    Rheumatology  Oncology  Dermatology  Cystic Fibrosis          How many days over the past month did your RA  keep you from your normal activities? For example, brushing your teeth or getting up in the morning. Patient has been in pain for the last     Have you discussed this with your provider?  Patient will send message to MD    Acute Infection Status:    Acute infections noted within Epic:  No active infections  Patient reported infection: None    Therapy Appropriateness:    Is therapy appropriate based on current medication list, adverse reactions, adherence, clinical benefit and progress toward achieving therapeutic goals? Yes, therapy is appropriate and should be continued     Clinical Intervention:    Was an intervention completed as part of this clinical assessment? No    DISEASE/MEDICATION-SPECIFIC INFORMATION      For patients on injectable medications: Patient currently has 0 doses left.  Next injection is scheduled for was due 3/13 but will take on 3/18.    Chronic Inflammatory Diseases: Have you experienced any flares in the last month? Yes, currently feeling flare now.  Pt will reach out to clinic    PATIENT SPECIFIC NEEDS     Does the patient have any physical, cognitive, or cultural barriers? No    Is the patient high risk? No and Yes, pregnant/nursing patient. Contraindications have been assessed    Does the patient require physician intervention or other additional services (i.e., nutrition, smoking cessation, social work)? No    Does the patient have an additional or emergency contact listed in their chart? Yes    SOCIAL DETERMINANTS OF HEALTH     At the [facility], we have learned that life circumstances - like trouble affording food, housing, utilities, or transportation can affect the health of many of our patients.   That is why we wanted to ask: are you currently experiencing any life circumstances that are negatively impacting your health and/or quality of life? Patient declined to answer    Social Drivers of Health     Food Insecurity: Not on file   Tobacco Use: Low Risk  (03/16/2023)    Patient History     Smoking Tobacco Use: Never     Smokeless Tobacco Use: Never     Passive Exposure: Never   Recent Concern: Tobacco Use - High Risk (03/08/2023)    Received from [facility]    Patient History     Smoking Tobacco Use: Some Days     Smokeless Tobacco Use: Never     Passive Exposure: Not on file   Transportation Needs: Not on file   Alcohol Use: Not on file   Housing: Low Risk  (12/12/2022)    Received from [facility]    Housing Stability Vital Sign     What is your living situation today?: I have a steady place to live     Think about the place you live. Do you have problems with any of the following? Choose all that apply:: None/None on this list   Physical Activity: Not on file   Utilities: Not on file   Stress: Not on file   Interpersonal Safety: Not on file   Substance Use: Not on file (03/19/2023)   Intimate Partner Violence: Not on file   Social Connections: Not on file   Financial Resource Strain: Not on file   Depression: Not at risk (12/12/2022)    Received from [facility]    PHQ-2     Patient Health Questionnaire-2 Score: 0   Internet Connectivity: Not on file   Health Literacy: Not on file       Would you be willing to receive help with any of the needs that you have identified today? No       SHIPPING Specialty Medication(s) to be Shipped:   Inflammatory Disorders: Cimzia    Other medication(s) to be shipped: No additional medications requested for fill at this  time     Changes to insurance: No    Cost and Payment: Patient has a copay of $4. They are aware and have authorized the pharmacy to charge the credit card on file.    Delivery Scheduled: Yes, Expected medication delivery date: 3/18.     Medication will be delivered via UPS to the confirmed prescription address in [facility].    The patient will receive a drug information handout for each medication shipped and additional FDA Medication Guides as required.  Verified that patient has previously received a Conservation officer, historic buildings and a Surveyor, mining.    The patient or caregiver noted above participated in the development of this care plan and knows that they can request review of or adjustments to the care plan at any time.      All of the patient's questions and concerns have been addressed.    [name], PharmD   [facility] Specialty Pharmacist

## 2023-07-30 MED FILL — CIMZIA 400 MG/2 ML (200 MG/ML X 2) SUBCUTANEOUS SYRINGE KIT: SUBCUTANEOUS | 28 days supply | Qty: 2 | Fill #6

## 2023-08-09 ENCOUNTER — Ambulatory Visit
Admit: 2023-08-09 | Discharge: 2023-08-10 | Payer: PRIVATE HEALTH INSURANCE | Attending: Student in an Organized Health Care Education/Training Program | Primary: Student in an Organized Health Care Education/Training Program

## 2023-08-09 DIAGNOSIS — M059 Rheumatoid arthritis with rheumatoid factor, unspecified: Principal | ICD-10-CM

## 2023-08-09 LAB — CBC W/ AUTO DIFF
BASOPHILS ABSOLUTE COUNT: 0.1 10*9/L (ref 0.0–0.1)
BASOPHILS RELATIVE PERCENT: 0.6 %
EOSINOPHILS ABSOLUTE COUNT: 0.2 10*9/L (ref 0.0–0.5)
EOSINOPHILS RELATIVE PERCENT: 1.8 %
HEMATOCRIT: 32.4 % — ABNORMAL LOW (ref 34.0–44.0)
HEMOGLOBIN: 10.7 g/dL — ABNORMAL LOW (ref 11.3–14.9)
LYMPHOCYTES ABSOLUTE COUNT: 2.3 10*9/L (ref 1.1–3.6)
LYMPHOCYTES RELATIVE PERCENT: 25.1 %
MEAN CORPUSCULAR HEMOGLOBIN CONC: 33.1 g/dL (ref 32.0–36.0)
MEAN CORPUSCULAR HEMOGLOBIN: 23.3 pg — ABNORMAL LOW (ref 25.9–32.4)
MEAN CORPUSCULAR VOLUME: 70.5 fL — ABNORMAL LOW (ref 77.6–95.7)
MEAN PLATELET VOLUME: 7.5 fL (ref 6.8–10.7)
MONOCYTES ABSOLUTE COUNT: 0.5 10*9/L (ref 0.3–0.8)
MONOCYTES RELATIVE PERCENT: 5.1 %
NEUTROPHILS ABSOLUTE COUNT: 6.1 10*9/L (ref 1.8–7.8)
NEUTROPHILS RELATIVE PERCENT: 67.4 %
PLATELET COUNT: 449 10*9/L (ref 150–450)
RED BLOOD CELL COUNT: 4.6 10*12/L (ref 3.95–5.13)
RED CELL DISTRIBUTION WIDTH: 16.4 % — ABNORMAL HIGH (ref 12.2–15.2)
WBC ADJUSTED: 9.1 10*9/L (ref 3.6–11.2)

## 2023-08-09 LAB — COMPREHENSIVE METABOLIC PANEL
ALBUMIN: 3.7 g/dL (ref 3.4–5.0)
ALKALINE PHOSPHATASE: 127 U/L — ABNORMAL HIGH (ref 46–116)
ALT (SGPT): 47 U/L (ref 10–49)
ANION GAP: 11 mmol/L (ref 5–14)
AST (SGOT): 30 U/L (ref <=34)
BILIRUBIN TOTAL: 0.2 mg/dL — ABNORMAL LOW (ref 0.3–1.2)
BLOOD UREA NITROGEN: 14 mg/dL (ref 9–23)
BUN / CREAT RATIO: 22
CALCIUM: 9.6 mg/dL (ref 8.7–10.4)
CHLORIDE: 102 mmol/L (ref 98–107)
CO2: 26.1 mmol/L (ref 20.0–31.0)
CREATININE: 0.63 mg/dL (ref 0.55–1.02)
EGFR CKD-EPI (2021) FEMALE: >90 mL/min/{1.73_m2} (ref >=60)
GLUCOSE RANDOM: 92 mg/dL (ref 70–179)
POTASSIUM: 3.9 mmol/L (ref 3.4–4.8)
PROTEIN TOTAL: 7.5 g/dL (ref 5.7–8.2)
SODIUM: 139 mmol/L (ref 135–145)

## 2023-08-09 LAB — C-REACTIVE PROTEIN: C-REACTIVE PROTEIN: <5 mg/L (ref <=10.0)

## 2023-08-09 LAB — SEDIMENTATION RATE: ERYTHROCYTE SEDIMENTATION RATE: 46 mm/h — ABNORMAL HIGH (ref 0–20)

## 2023-08-09 MED ORDER — HYDROXYCHLOROQUINE 200 MG TABLET
ORAL_TABLET | Freq: Every day | ORAL | 3 refills | 90 days | Status: CP
Start: 2023-08-09 — End: 2024-08-08

## 2023-08-09 MED ORDER — PREDNISONE 5 MG TABLET
ORAL_TABLET | Freq: Every day | ORAL | 0 refills | 90 days | Status: CP
Start: 2023-08-09 — End: ?

## 2023-08-09 NOTE — Unmapped (Signed)
 Rheumatology Clinic Follow-up Note     Assessment/Plan:    Alicia Norris is a 23 y.o.  female with a PMH of seropositive RA who presents today for follow up of RA.    Initially started on MTX with improvement but remained active despite 20mg  PO. Ultimately started on Enbrel 09/2021, she self-d/c'd MTX. Did well with Enbrel but due to pregnancy (~09/2022), switched to Cimzia monotherapy.    Since last visit, she has delivered a healthy child and reports no pregnancy complications.    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 We discussed options today including changing biologic vs addition of alternative agent. As she has responded the the Cimzia, shared decision to add a HCQ + Pred 5mg , we discussed that I suspect she will ultimately need MTX but deferring for a bit while she is breastfeeding.    - Cont Cimzia  - Start HCQ 200mg  BID  ---- Discussed need for annual eye exams  - Restart Pred 5mg   - CBC w/ diff, CMP, ESR, CRP    Swollen Joint Count (0-28): 4  Tender Joint Count (0-28): 6  Patient Global Assessment of Disease Activity (0-10): 1  Evaluator Global Assessment of Disease (0-10): 4  CDAI Score: 15  CDAI interpretation:  0.0-2.8 Remission   2.9-10.0 Low disease activity   10.1-22.0 Moderate disease activity   22.1-76.0 High disease activity       Return in about 3 months (around 11/09/2023).    [name], MD   Assistant Professor of Medicine  Department of Medicine/Division of Rheumatology  [facility]    Primary Care Provider: [facility]    HPI:  Alicia Norris is a 23 y.o.  female with a PMH of seropositive RA who presents today for follow up of RA.    Today  Delivered a healthy baby without issue. SHe is very happy. Says her joint disease is doing well but then reports >2hrs of morning stiffness, swelling with impaired function most mornings, and ongoing arthralgia in hands and feet predominately. She then changes her mind and feels her joint disease is more active then she thought. Tolerating Cimzia well.     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 Re-presented 01/2021 and was [redacted]wks pregnant, was given pred monotherapy again. Started SSZ 02/2021, took for a few months but presented [facility] on [facility] with flare, first saw [facility] 06/10/21 and started on MTX at that time. On MTX 20mg  PO she had significant improvement but remained active, Enbrel added ~09/2021.    Review of Systems:  Positive findings noted above, otherwise a 14 point review of systems was reviewed and negative    Past Medical, Surgical, Family and Social History reviewed and updated per EMR     Allergies:  Cat dander    Medications:     Current Outpatient Medications:     acetaminophen (TYLENOL) 500 MG tablet, Take 2 tablets (1,000 mg total) by mouth every eight (8) hours., Disp: 30 tablet, Rfl: 0    certolizumab pegol (CIMZIA) 400 mg/2 mL (200 mg/mL x 2) SyKt injection syringe, Inject the contents of 1 syringe (200 mg total) under the skin every fourteen (14) days., Disp: 6 mL, Rfl: 3    empty container (SHARPS-A-GATOR DISPOSAL SYSTEM) Misc, Use as directed, Disp: 1 each,  Rfl: 2    ferrous sulfate 325 (65 FE) MG tablet, Take 1 tablet (325 mg total) by mouth., Disp: , Rfl:     ibuprofen (MOTRIN) 800 MG tablet, , Disp: , Rfl:     norethindrone (MICRONOR) 0.35 mg tablet, Take 1 tablet (0.35 mg total) by mouth daily., Disp: , Rfl:     prenatal vit no.126-iron-folic 28 mg iron- 800 mcg Tab, Take 1 tablet by mouth daily., Disp: , Rfl:     copper 380 square MM (PARAGARD T 380A) 380 square mm IUD intrauterine device, 1 each by Intrauterine route once. (Patient not taking: Reported on 08/09/2023), Disp: , Rfl:     cyclobenzaprine (FLEXERIL) 5 MG tablet, Take 1 tablet (5 mg total) by mouth Three (3) times a day as needed for muscle spasms. (Patient not taking: Reported on 08/09/2023), Disp: 60 tablet, Rfl: 0    hydroxychloroquine (PLAQUENIL) 200 mg tablet, Take 2 tablets (400 mg total) by mouth daily., Disp: 180 tablet, Rfl: 3    predniSONE (DELTASONE) 5 MG tablet, Take 1 tablet (5 mg total) by mouth daily., Disp: 90 tablet, Rfl: 0      Objective   Vitals:    08/09/23 1459   BP: 106/69   BP Site: L Arm   BP Position: Sitting   Pulse: 104   Temp: 36.3 ??C (97.3 ??F)   TempSrc: Temporal   Weight: 83.8 kg (184 lb 12.8 oz)               Physical Exam  General: well appearing, no acute distress, pleasant  HEENT: EOMI, normal conjunctivae, MMM  Cardiovascular: Regular rate and rhythm. No murmurs, rubs or gallops.   Pulmonary: Clear to auscultation bilaterally. Normal work of breathing.  Skin: No rash, lesions, breakdown. No purpura or petechiae. No digital ulcers.   Extremities: Warm and well perfused, no cyanosis, clubbing or edema  Musculoskeletal: R ankle swollen with severely impaired ROM and warmth. L with very slight fullness. No synovitis or tenderness to palpation in the hands, wrists, elbows, shoulders, knees, or feet. Full ROM throughout.   Neurologic: Cranial nerves grossly intact, strength 5/5 throughout.  Normal sensation  Psychiatric: Normal mood and affect.    I personally spent 37 minutes face-to-face and non-face-to-face in the care of this patient, which includes all pre, intra, and post visit time on the date of service.  All documented time was specific to the E/M visit and does not include any procedures that may have been performed.

## 2023-08-30 IMAGING — CT CT ABD-PELV W/ CM
2 of 4 series · 17 of 46 positions shown, 19 images · IV contrast (APPLIED)
Comparison: 01/02/2018

CLINICAL DATA: Abdominal pain, blood in stool for 1 week, 2 months
postpartum, rectal bleeding, seen bright red blood and blood clots
in toilet and on toilet paper after a bowel movement

EXAM:
CT ABDOMEN AND PELVIS WITH CONTRAST
TECHNIQUE: Multidetector CT imaging of the abdomen and pelvis was performed
using the standard protocol following bolus administration of
intravenous contrast.

[Series 3: abd/ pelvis 5.0 i30f 2 · axial · 0.81mm/px · z∈[+759,+1204]mm · 14 of 99 slices shown, 16 images]
[im 5/99  soft-tissue]
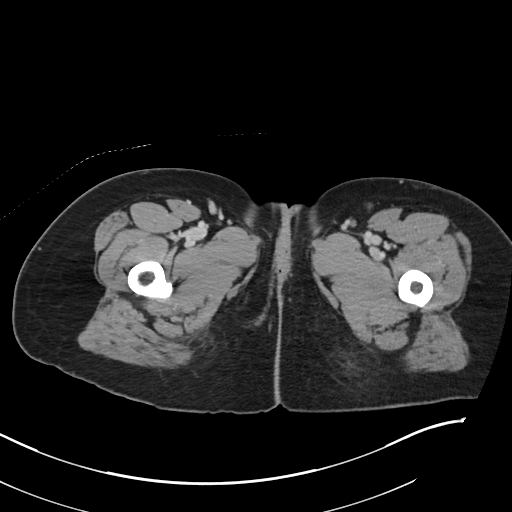
[im 5/99  bone]
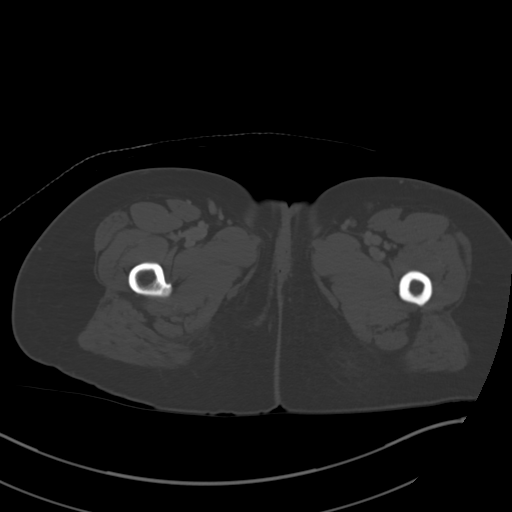
[im 14/99  soft-tissue]
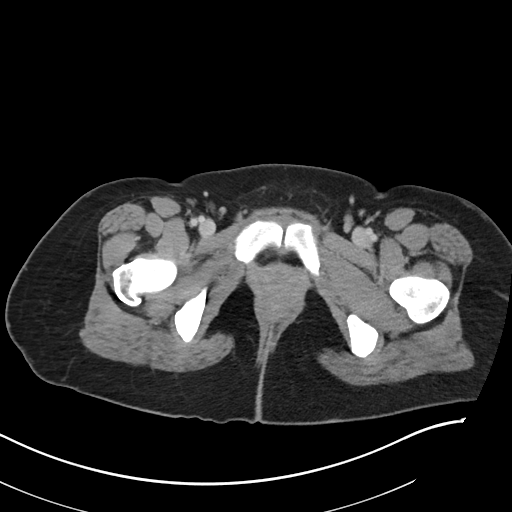
[im 18/99  soft-tissue]
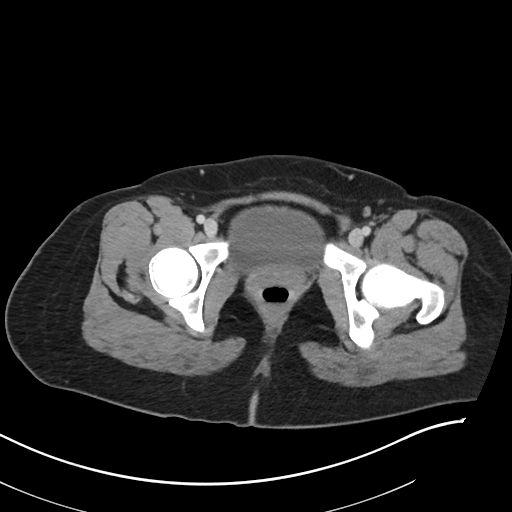
[im 27/99  soft-tissue]
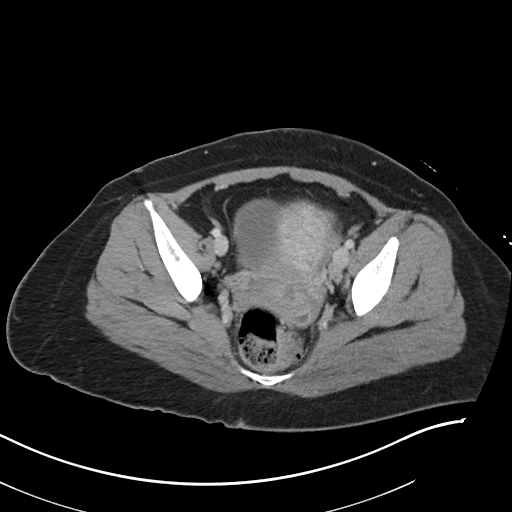
[im 32/99  soft-tissue]
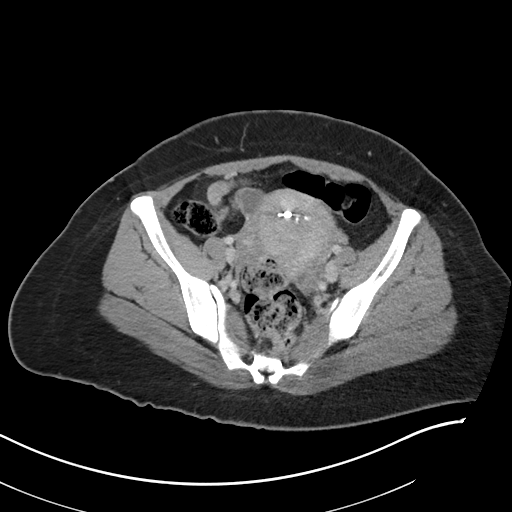
[im 41/99  soft-tissue]
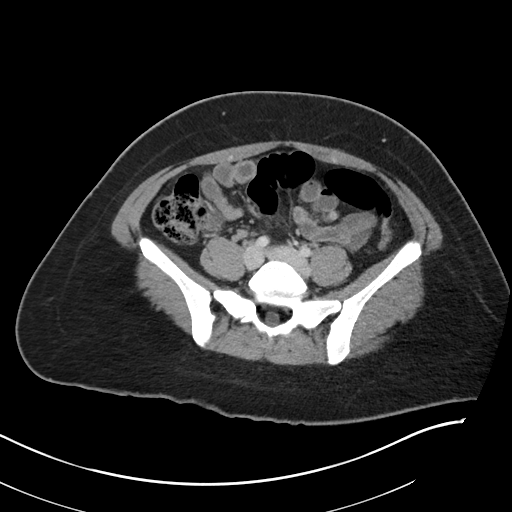
[im 45/99  soft-tissue]
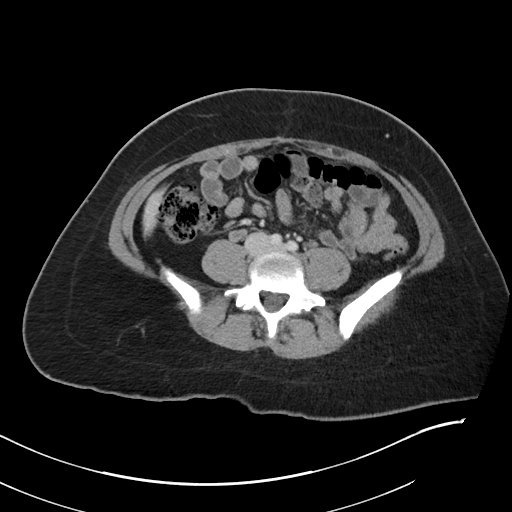
[im 54/99  soft-tissue]
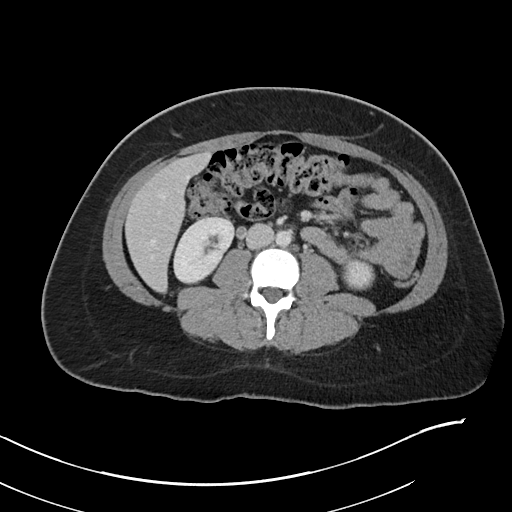
[im 58/99  soft-tissue]
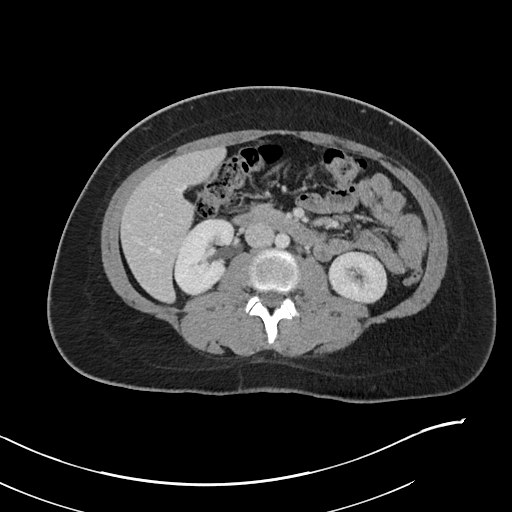
[im 58/99  bone]
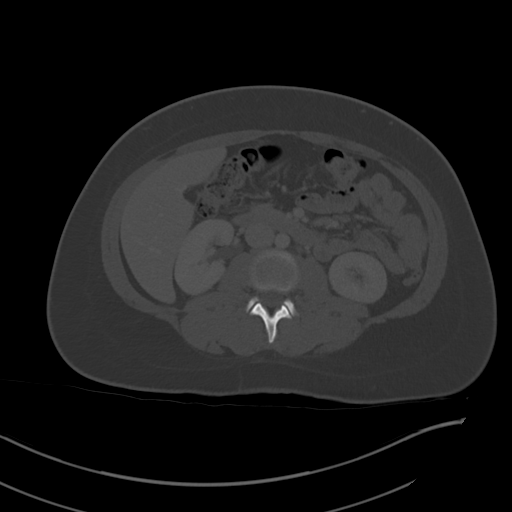
[im 67/99  soft-tissue]
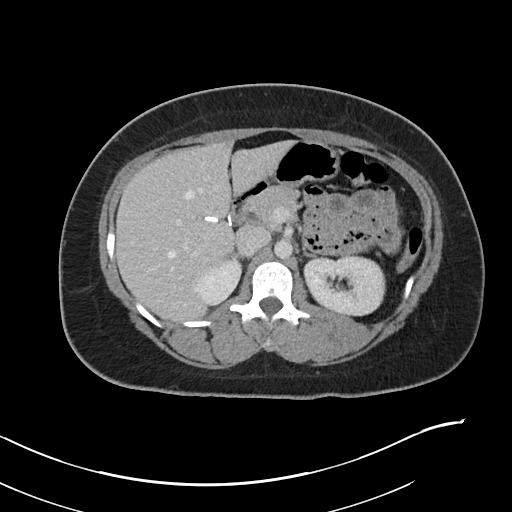
[im 72/99  soft-tissue]
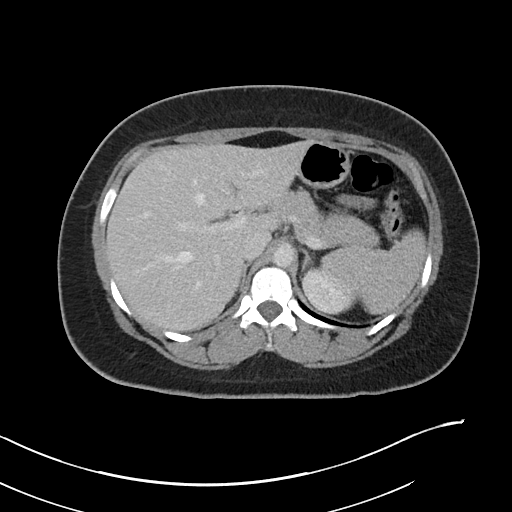
[im 81/99  soft-tissue]
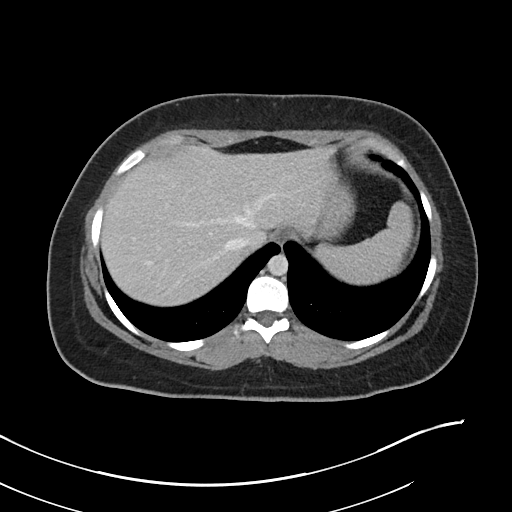
[im 85/99  soft-tissue]
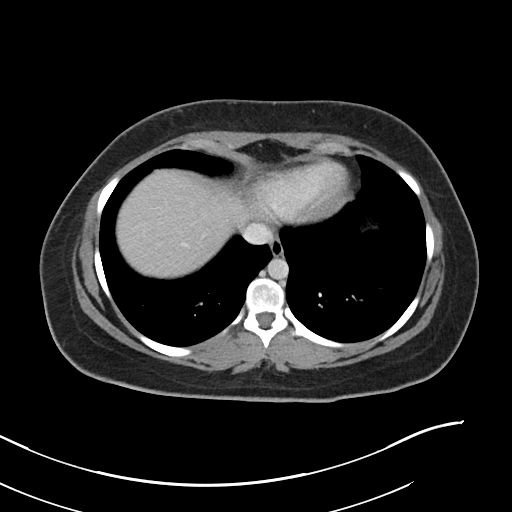
[im 94/99  soft-tissue]
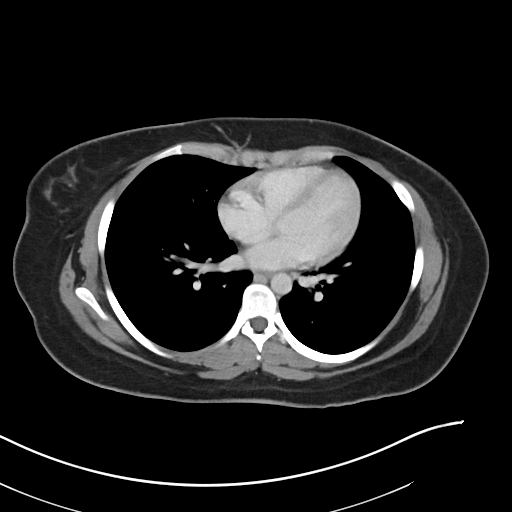

[Series 6: coronal soft tissue · coronal · 0.94mm/px · 3 of 108 slices shown]
[im 36/108  soft-tissue]
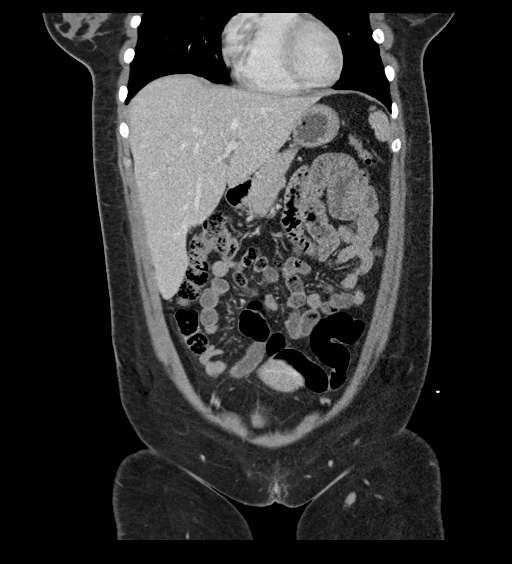
[im 48/108  soft-tissue]
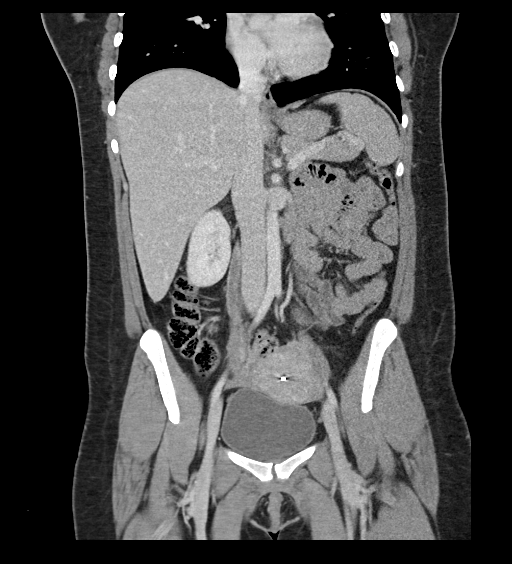
[im 60/108  soft-tissue]
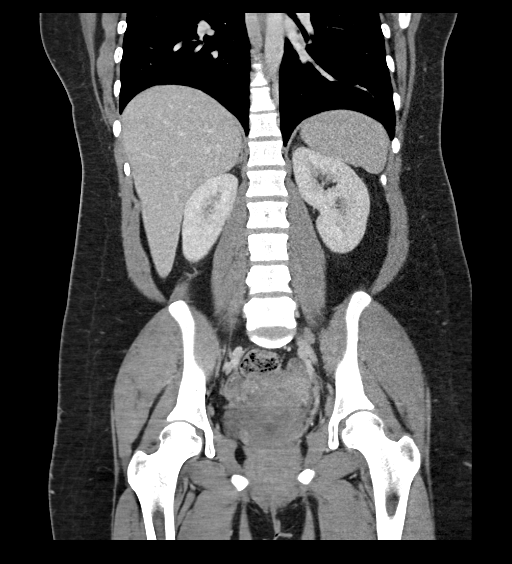

[17 of 46 positions shown; findings below may reference images not displayed]

RADIATION DOSE REDUCTION: This exam was performed according to the
departmental dose-optimization program which includes automated
exposure control, adjustment of the mA and/or kV according to
patient size and/or use of iterative reconstruction technique.

CONTRAST:  80mL OMNIPAQUE IOHEXOL 350 MG/ML SOLN IV. No oral
contrast.
FINDINGS: Lower chest: Lung bases clear

Hepatobiliary: Gallbladder surgically absent. Liver normal
appearance. No biliary dilatation.

Pancreas: Normal appearance

Spleen: Normal appearance

Adrenals/Urinary Tract: Adrenal glands, kidneys, ureters, and
bladder normal appearance

Stomach/Bowel: Normal appendix. Stomach and bowel loops normal
appearance.

Vascular/Lymphatic: Vascular structures patent.  No adenopathy.

Reproductive: Small amount of fluid in vagina. IUD in uterus. Uterus
and ovaries otherwise unremarkable for age.

Other: No free air or free fluid. No hernia or inflammatory process.

Musculoskeletal: Unremarkable
IMPRESSION: Post cholecystectomy.

IUD in uterus.

No acute intra-abdominal or intrapelvic abnormalities.

## 2023-08-31 NOTE — Unmapped (Signed)
 [facility] Refill Coordination Note    Specialty Medication(s) to be Shipped:   Inflammatory Disorders: Cimzia     Other medication(s) to be shipped: No additional medications requested for fill at this time     [facility], DOB: 2001/01/19  Phone: [phone] (home)       All above HIPAA information was verified with patient.     Was a Nurse, learning disability used for this call? No    Completed refill call assessment today to schedule patient's medication shipment from the [facility]  [phone]).  All relevant notes have been reviewed.     Specialty medication(s) and dose(s) confirmed: Regimen is correct and unchanged.   Changes to medications: [name] reports no changes at this time.  Changes to insurance: No  New side effects reported not previously addressed with a pharmacist or physician: None reported  Questions for the pharmacist: No    Confirmed patient received a Conservation officer, historic buildings and a Surveyor, mining with first shipment. The patient will receive a drug information handout for each medication shipped and additional FDA Medication Guides as required.       DISEASE/MEDICATION-SPECIFIC INFORMATION        For patients on injectable medications: Patient currently has 0 doses left.  Next injection is scheduled for 08/30/23.    SPECIALTY MEDICATION ADHERENCE     Medication Adherence    Patient reported X missed doses in the last month: 1  Specialty Medication: certolizumab pegol : CIMZIA  400 mg/2 mL (200 mg/mL x 2) Sykt injection syringe  Patient is on additional specialty medications: No  Patient is on more than two specialty medications: No              Were doses missed due to medication being on hold? Yes - ins approved claim 4/18      CIMZIA  400 mg/2 mL (200 mg/mL x 2) Sykt injection syringe (certolizumab pegol )  : 0 doses of medicine on hand       REFERRAL TO PHARMACIST     Referral to the pharmacist: Not needed      SHIPPING     Shipping address confirmed in Epic.     Cost and Payment: Patient has a copay of $4. They are aware and have authorized the pharmacy to charge the credit card on file.    Delivery Scheduled: Yes, Expected medication delivery date: 09/04/23.     Medication will be delivered via UPS to the prescription address in Epic WAM.    Alicia Norris   [facility]  Specialty Technician

## 2023-09-03 MED FILL — CIMZIA 400 MG/2 ML (200 MG/ML X 2) SUBCUTANEOUS SYRINGE KIT: SUBCUTANEOUS | 28 days supply | Qty: 2 | Fill #7

## 2023-09-08 ENCOUNTER — Emergency Department (HOSPITAL_COMMUNITY)

## 2023-09-08 ENCOUNTER — Encounter (HOSPITAL_COMMUNITY): Payer: Self-pay

## 2023-09-08 ENCOUNTER — Inpatient Hospital Stay (HOSPITAL_COMMUNITY)
Admission: EM | Admit: 2023-09-08 | Discharge: 2023-09-11 | DRG: 862 | Disposition: A | Discharge status: Home / Self Care | Attending: Internal Medicine | Admitting: Internal Medicine

## 2023-09-08 ENCOUNTER — Other Ambulatory Visit: Payer: Self-pay

## 2023-09-08 DIAGNOSIS — Z79899 Other long term (current) drug therapy: Secondary | ICD-10-CM

## 2023-09-08 DIAGNOSIS — E66812 Obesity, class 2: Secondary | ICD-10-CM | POA: Diagnosis present

## 2023-09-08 DIAGNOSIS — Y838 Other surgical procedures as the cause of abnormal reaction of the patient, or of later complication, without mention of misadventure at the time of the procedure: Secondary | ICD-10-CM | POA: Diagnosis present

## 2023-09-08 DIAGNOSIS — A419 Sepsis, unspecified organism: Secondary | ICD-10-CM | POA: Diagnosis not present

## 2023-09-08 DIAGNOSIS — Z9621 Cochlear implant status: Secondary | ICD-10-CM | POA: Diagnosis present

## 2023-09-08 DIAGNOSIS — I959 Hypotension, unspecified: Secondary | ICD-10-CM | POA: Diagnosis present

## 2023-09-08 DIAGNOSIS — Z833 Family history of diabetes mellitus: Secondary | ICD-10-CM

## 2023-09-08 DIAGNOSIS — T8144XA Sepsis following a procedure, initial encounter: Secondary | ICD-10-CM | POA: Diagnosis present

## 2023-09-08 DIAGNOSIS — Z9079 Acquired absence of other genital organ(s): Secondary | ICD-10-CM

## 2023-09-08 DIAGNOSIS — E872 Acidosis, unspecified: Secondary | ICD-10-CM | POA: Diagnosis present

## 2023-09-08 DIAGNOSIS — Z9851 Tubal ligation status: Secondary | ICD-10-CM | POA: Diagnosis not present

## 2023-09-08 DIAGNOSIS — M069 Rheumatoid arthritis, unspecified: Secondary | ICD-10-CM | POA: Insufficient documentation

## 2023-09-08 DIAGNOSIS — J189 Pneumonia, unspecified organism: Secondary | ICD-10-CM | POA: Diagnosis present

## 2023-09-08 DIAGNOSIS — R6520 Severe sepsis without septic shock: Principal | ICD-10-CM

## 2023-09-08 DIAGNOSIS — Z1152 Encounter for screening for COVID-19: Secondary | ICD-10-CM

## 2023-09-08 DIAGNOSIS — Z8739 Personal history of other diseases of the musculoskeletal system and connective tissue: Secondary | ICD-10-CM

## 2023-09-08 DIAGNOSIS — Z6836 Body mass index (BMI) 36.0-36.9, adult: Secondary | ICD-10-CM

## 2023-09-08 DIAGNOSIS — D509 Iron deficiency anemia, unspecified: Secondary | ICD-10-CM | POA: Diagnosis present

## 2023-09-08 DIAGNOSIS — D638 Anemia in other chronic diseases classified elsewhere: Secondary | ICD-10-CM | POA: Diagnosis present

## 2023-09-08 DIAGNOSIS — M62838 Other muscle spasm: Secondary | ICD-10-CM | POA: Diagnosis present

## 2023-09-08 DIAGNOSIS — Z9049 Acquired absence of other specified parts of digestive tract: Secondary | ICD-10-CM | POA: Diagnosis not present

## 2023-09-08 DIAGNOSIS — Z87891 Personal history of nicotine dependence: Secondary | ICD-10-CM

## 2023-09-08 DIAGNOSIS — J159 Unspecified bacterial pneumonia: Secondary | ICD-10-CM | POA: Diagnosis present

## 2023-09-08 LAB — CBC WITH DIFFERENTIAL/PLATELET
Abs Immature Granulocytes: 0.07 10*3/uL (ref 0.00–0.07)
Basophils Absolute: 0 10*3/uL (ref 0.0–0.1)
Basophils Relative: 0 %
Eosinophils Absolute: 0.2 10*3/uL (ref 0.0–0.5)
Eosinophils Relative: 1 %
HCT: 39 % (ref 36.0–46.0)
Hemoglobin: 11.5 g/dL — ABNORMAL LOW (ref 12.0–15.0)
Immature Granulocytes: 0 %
Lymphocytes Relative: 7 %
Lymphs Abs: 1.2 10*3/uL (ref 0.7–4.0)
MCH: 23 pg — ABNORMAL LOW (ref 26.0–34.0)
MCHC: 29.5 g/dL — ABNORMAL LOW (ref 30.0–36.0)
MCV: 77.8 fL — ABNORMAL LOW (ref 80.0–100.0)
Monocytes Absolute: 0.2 10*3/uL (ref 0.1–1.0)
Monocytes Relative: 1 %
Neutro Abs: 15.6 10*3/uL — ABNORMAL HIGH (ref 1.7–7.7)
Neutrophils Relative %: 91 %
Platelets: 349 10*3/uL (ref 150–400)
RBC: 5.01 MIL/uL (ref 3.87–5.11)
RDW: 18.1 % — ABNORMAL HIGH (ref 11.5–15.5)
WBC: 17.4 10*3/uL — ABNORMAL HIGH (ref 4.0–10.5)
nRBC: 0 % (ref 0.0–0.2)

## 2023-09-08 LAB — COMPREHENSIVE METABOLIC PANEL WITH GFR
ALT: 139 U/L — ABNORMAL HIGH (ref 0–44)
AST: 65 U/L — ABNORMAL HIGH (ref 15–41)
Albumin: 3.5 g/dL (ref 3.5–5.0)
Alkaline Phosphatase: 113 U/L (ref 38–126)
Anion gap: 10 (ref 5–15)
BUN: 13 mg/dL (ref 6–20)
CO2: 19 mmol/L — ABNORMAL LOW (ref 22–32)
Calcium: 8.5 mg/dL — ABNORMAL LOW (ref 8.9–10.3)
Chloride: 106 mmol/L (ref 98–111)
Creatinine, Ser: 0.73 mg/dL (ref 0.44–1.00)
GFR, Estimated: >60 mL/min (ref >60)
Glucose, Bld: 109 mg/dL — ABNORMAL HIGH (ref 70–99)
Potassium: 4 mmol/L (ref 3.5–5.1)
Sodium: 135 mmol/L (ref 135–145)
Total Bilirubin: 0.3 mg/dL (ref 0.0–1.2)
Total Protein: 6.9 g/dL (ref 6.5–8.1)

## 2023-09-08 LAB — RESP PANEL BY RT-PCR (RSV, FLU A&B, COVID)  RVPGX2
Influenza A by PCR: NEGATIVE
Influenza B by PCR: NEGATIVE
Resp Syncytial Virus by PCR: NEGATIVE
SARS Coronavirus 2 by RT PCR: NEGATIVE

## 2023-09-08 LAB — URINALYSIS, W/ REFLEX TO CULTURE (INFECTION SUSPECTED)
Bacteria, UA: NONE SEEN
Bilirubin Urine: NEGATIVE
Glucose, UA: NEGATIVE mg/dL
Hgb urine dipstick: NEGATIVE
Ketones, ur: NEGATIVE mg/dL
Leukocytes,Ua: NEGATIVE
Nitrite: NEGATIVE
Protein, ur: NEGATIVE mg/dL
Specific Gravity, Urine: 1.014 (ref 1.005–1.030)
pH: 7 (ref 5.0–8.0)

## 2023-09-08 LAB — HCG, QUANTITATIVE, PREGNANCY: hCG, Beta Chain, Quant, S: <1 m[IU]/mL (ref <5)

## 2023-09-08 LAB — LACTIC ACID, PLASMA
Lactic Acid, Venous: 2 mmol/L (ref 0.5–1.9)
Lactic Acid, Venous: 1.7 mmol/L (ref 0.5–1.9)

## 2023-09-08 MED ORDER — KETOROLAC TROMETHAMINE 15 MG/ML IJ SOLN
10.0000 mg | Freq: Once | INTRAMUSCULAR | Status: AC
  Administered 2023-09-08: 10 mg via INTRAVENOUS
  Filled 2023-09-08: qty 1

## 2023-09-08 MED ORDER — SODIUM CHLORIDE 0.9 % IV SOLN: 250.0000 mL | INTRAVENOUS | Status: AC | PRN

## 2023-09-08 MED ORDER — VANCOMYCIN HCL 750 MG/150ML IV SOLN
750.0000 mg | Freq: Two times a day (BID) | INTRAVENOUS | Status: AC
Start: 2023-09-09 — End: 2023-09-09
  Administered 2023-09-09: 750 mg via INTRAVENOUS
  Filled 2023-09-08: qty 150

## 2023-09-08 MED ORDER — FERROUS SULFATE 325 (65 FE) MG PO TABS
325.0000 mg | ORAL_TABLET | ORAL | Status: DC
  Administered 2023-09-08 – 2023-09-10 (×2): 325 mg via ORAL
  Filled 2023-09-08 (×2): qty 1

## 2023-09-08 MED ORDER — DOCUSATE SODIUM 100 MG PO CAPS
100.0000 mg | ORAL_CAPSULE | Freq: Two times a day (BID) | ORAL | Status: DC
  Administered 2023-09-09 – 2023-09-10 (×4): 100 mg via ORAL
  Filled 2023-09-08 (×4): qty 1

## 2023-09-08 MED ORDER — LACTATED RINGERS IV BOLUS
1000.0000 mL | Freq: Once | INTRAVENOUS | Status: AC
  Administered 2023-09-08: 1000 mL via INTRAVENOUS

## 2023-09-08 MED ORDER — ACETAMINOPHEN 650 MG RE SUPP: 650.0000 mg | Freq: Four times a day (QID) | RECTAL | Status: DC | PRN

## 2023-09-08 MED ORDER — SODIUM CHLORIDE 0.9% FLUSH: 3.0000 mL | INTRAVENOUS | Status: DC | PRN

## 2023-09-08 MED ORDER — SODIUM CHLORIDE 0.9 % IV SOLN
2.0000 g | Freq: Three times a day (TID) | INTRAVENOUS | Status: DC
  Administered 2023-09-09: 2 g via INTRAVENOUS
  Filled 2023-09-08: qty 12.5

## 2023-09-08 MED ORDER — MORPHINE SULFATE (PF) 4 MG/ML IV SOLN
4.0000 mg | Freq: Once | INTRAVENOUS | Status: AC
  Administered 2023-09-08: 4 mg via INTRAVENOUS
  Filled 2023-09-08: qty 1

## 2023-09-08 MED ORDER — PANTOPRAZOLE SODIUM 40 MG PO TBEC
40.0000 mg | DELAYED_RELEASE_TABLET | Freq: Every day | ORAL | Status: DC
  Administered 2023-09-09 – 2023-09-10 (×2): 40 mg via ORAL
  Filled 2023-09-08 (×2): qty 1

## 2023-09-08 MED ORDER — HYDROCODONE-ACETAMINOPHEN 5-325 MG PO TABS
1.0000 | ORAL_TABLET | ORAL | Status: DC | PRN
  Administered 2023-09-09 (×3): 2 via ORAL
  Administered 2023-09-10 (×2): 1 via ORAL
  Filled 2023-09-08 (×3): qty 2
  Filled 2023-09-08: qty 1
  Filled 2023-09-08: qty 2

## 2023-09-08 MED ORDER — ONDANSETRON HCL 4 MG/2ML IJ SOLN
4.0000 mg | Freq: Four times a day (QID) | INTRAMUSCULAR | Status: DC | PRN
  Administered 2023-09-09 (×2): 4 mg via INTRAVENOUS
  Filled 2023-09-08 (×2): qty 2

## 2023-09-08 MED ORDER — VANCOMYCIN HCL 2000 MG/400ML IV SOLN
2000.0000 mg | Freq: Once | INTRAVENOUS | Status: AC
  Administered 2023-09-08: 2000 mg via INTRAVENOUS
  Filled 2023-09-08: qty 400

## 2023-09-08 MED ORDER — METRONIDAZOLE 500 MG/100ML IV SOLN
500.0000 mg | Freq: Once | INTRAVENOUS | Status: AC
  Administered 2023-09-08: 500 mg via INTRAVENOUS
  Filled 2023-09-08: qty 100

## 2023-09-08 MED ORDER — ACETAMINOPHEN 500 MG PO TABS
1000.0000 mg | ORAL_TABLET | Freq: Once | ORAL | Status: AC
  Administered 2023-09-08: 1000 mg via ORAL
  Filled 2023-09-08: qty 2

## 2023-09-08 MED ORDER — ONDANSETRON HCL 4 MG PO TABS
4.0000 mg | ORAL_TABLET | Freq: Four times a day (QID) | ORAL | Status: DC | PRN
  Administered 2023-09-10 (×2): 4 mg via ORAL
  Filled 2023-09-08 (×2): qty 1

## 2023-09-08 MED ORDER — SODIUM CHLORIDE 0.9 % IV SOLN
2.0000 g | Freq: Once | INTRAVENOUS | Status: AC
  Administered 2023-09-08: 2 g via INTRAVENOUS
  Filled 2023-09-08: qty 12.5

## 2023-09-08 MED ORDER — SODIUM CHLORIDE 0.9% FLUSH
3.0000 mL | Freq: Two times a day (BID) | INTRAVENOUS | Status: DC
  Administered 2023-09-09 – 2023-09-10 (×3): 3 mL via INTRAVENOUS

## 2023-09-08 MED ORDER — ACETAMINOPHEN 325 MG PO TABS
650.0000 mg | ORAL_TABLET | Freq: Four times a day (QID) | ORAL | Status: DC | PRN
  Administered 2023-09-11: 650 mg via ORAL
  Filled 2023-09-08: qty 2

## 2023-09-08 MED ORDER — IOHEXOL 350 MG/ML SOLN
75.0000 mL | Freq: Once | INTRAVENOUS | Status: AC | PRN
  Administered 2023-09-08: 75 mL via INTRAVENOUS

## 2023-09-08 MED ORDER — MORPHINE SULFATE (PF) 2 MG/ML IV SOLN: 1.0000 mg | INTRAVENOUS | Status: DC | PRN

## 2023-09-08 MED ORDER — ENOXAPARIN SODIUM 60 MG/0.6ML IJ SOSY
45.0000 mg | PREFILLED_SYRINGE | INTRAMUSCULAR | Status: DC
  Administered 2023-09-09 – 2023-09-10 (×2): 45 mg via SUBCUTANEOUS
  Filled 2023-09-08 (×2): qty 0.6

## 2023-09-08 MED ORDER — ONDANSETRON HCL 4 MG/2ML IJ SOLN
4.0000 mg | Freq: Once | INTRAMUSCULAR | Status: AC
  Administered 2023-09-08: 4 mg via INTRAVENOUS
  Filled 2023-09-08: qty 2

## 2023-09-08 MED ORDER — LACTATED RINGERS IV SOLN: INTRAVENOUS | Status: AC

## 2023-09-08 MED ORDER — PREDNISONE 10 MG PO TABS
5.0000 mg | ORAL_TABLET | Freq: Every day | ORAL | Status: DC
  Administered 2023-09-09 – 2023-09-11 (×3): 5 mg via ORAL
  Filled 2023-09-08 (×3): qty 1

## 2023-09-08 NOTE — Progress Notes (Signed)
 Pharmacy Antibiotic Note  Theresa Todd is a 23 y.o. female admitted on 09/08/2023 with headache and body aches with concerns for sepsis de to pneumonia.  Pharmacy has been consulted for cefepime/vancomycin dosing. Patient previously had tubal ligation 4 days ago but incisions without evidence of infection  -CTa: Multifocal pneumonia, right lower lobe predominant  -Cefepime/flagyl x1 -WBC 17, sCr 0.73, Tmax 102.2, blood cultures collected  Plan: -Cefepime 2g IV every 8 hours -Vancomycin 2g IV x1 -Vancomycin 750mg  IV every 12 hours (AUC 468, Vd 0.5, IBW, sCr 0.8) -Order MRSA PCR -Monitor renal function -Follow up signs of clinical improvement, LOT, de-escalation of antibiotics   Weight: 88.5 kg (195 lb)  Temp (24hrs), Avg:99.8 F (37.7 C), Min:97.4 F (36.3 C), Max:102.2 F (39 C)  Recent Labs  Lab 09/08/23 1548 09/08/23 1700 09/08/23 2008  WBC 17.4*  --   --   CREATININE 0.73  --   --   LATICACIDVEN  --  2.0* 1.7    Estimated Creatinine Clearance: 109.7 mL/min (by C-G formula based on SCr of 0.73 mg/dL).    No Known Allergies  Antimicrobials this admission: Cefepime 4/26 >>  Vancomycin 4/26 >>   Microbiology results: 4/26 BCx:  4/26 MRSA PCR:   Thank you for allowing pharmacy to be a part of this patient's care.  Young Hensen, PharmD. Clinical Pharmacist 09/08/2023 10:12 PM

## 2023-09-08 NOTE — Sepsis Progress Note (Signed)
 Elink following code sepsis

## 2023-09-08 NOTE — H&P (Addendum)
 History and Physical    Theresa Todd ZOX:[id number] DOB: 07/02/2000 DOA: 09/08/2023  PCP: Patient, No Pcp Per   Patient coming from: Home   Chief Complaint:  Chief Complaint  Patient presents with   Multiple Complaints   ED TRIAGE note:Pt BIB EMS with c/o ABD pain, back pain, headache, and generalized bodyaches that started today. Pt did have tubal ligation 4 days ago. Per EMS, after movement pts HR increased to 150-160s. Pt endorses nausea, cough, and her kids have been sick recently.    HPI:  Theresa Todd is a 23 y.o. female with G4P4 medical history rheumatoid arthritis, anemia of chronic disease, chronic muscle spasm, and recently had a bilateral tubal ligation procedure done 4 days ago at [facility] presented to emergency department complaining of multiple complaints include generalized body ache, headache, pain in the lower abdomen and back pain. Patient denies any fever, chill, vaginal discharge, and vaginal bleeding.  In the ED patient has been evaluated by OB/GYN. Reviewed CT abdomen pelvis.The uterus overall looks normal given recent pregnancy and still being within the postpartum time period. Dr. Florie Husband and I discussed that CT is not the best modality for imaging of the uterus but EL is normal at 13 mm and her overall uterine size of 10x5 is normal, best visualized in the sagittal view. Otherwise, would await final read by radiology.  OB/GYN Dr. Vallarie Gauze reviewed imaging and discussed case with Dr. Florie Husband per OB/GYN CT abdomen pelvis no evidence of infectious source from uterine/IM origin.   At presentation to ED patient found tachycardic heart rate 135, febrile, hypotensive.  O2 sat 96% room air. Elevated lactic acid 2 which has been improved to 1.7. UA unremarkable. Blood cultures are in process. Normal respiratory panel.  Beta-hCG level below 1. CBC showing leukocytosis 17, stable H&H, chronic microcytic anemia and normal platelet count. CMP showing low bicarb  19, transaminitis elevated AST/ALT 65/139. EKG showing sinus tachycardia heart rate 158.  Chest x-ray showing bilateral patchy consolidation.  CT Abdo pelvis finding following: 1. Multifocal pneumonia, right lower lobe predominant, incompletely visualized. Consider follow-up chest radiographs in 4-6 weeks to document clearance. 2. Heterogeneous appearance of the posterior uterine myometrium, poorly evaluated. Consider pelvic ultrasound for further evaluation, as clinically Warranted.  In the ED code sepsis has been activated.  Patient has been treated with cefepime, metronidazole, 3 L of LR bolus.  Hospitalist has been consulted for further evaluation management of sepsis in the setting of pneumonia.   Significant labs in the ED: Lab Orders         Resp panel by RT-PCR (RSV, Flu A&B, Covid) Anterior Nasal Swab         Blood culture (routine x 2)         Expectorated Sputum Assessment w Gram Stain, Rflx to Resp Cult         MRSA Next Gen by PCR, Nasal         Comprehensive metabolic panel         CBC with Differential         hCG, quantitative, pregnancy         Urinalysis, w/ Reflex to Culture (Infection Suspected) -Urine, Clean Catch         Lactic acid, plasma         HIV Antibody (routine testing w rflx)         Legionella Pneumophila Serogp 1 Ur Ag         Strep pneumoniae urinary antigen  Comprehensive metabolic panel         CBC         Procalcitonin       Review of Systems:  Review of Systems  Constitutional:  Positive for fever and malaise/fatigue. Negative for chills and weight loss.  Respiratory:  Positive for cough. Negative for sputum production, shortness of breath and wheezing.   Cardiovascular:  Negative for chest pain, palpitations and leg swelling.  Gastrointestinal:  Positive for abdominal pain and nausea. Negative for blood in stool, constipation, melena and vomiting.  Genitourinary:  Negative for dysuria, frequency, hematuria and urgency.   Musculoskeletal:  Positive for back pain, joint pain and myalgias. Negative for neck pain.  Skin:  Negative for itching and rash.  Neurological:  Positive for headaches. Negative for dizziness.  Psychiatric/Behavioral:  The patient is not nervous/anxious.     Past Medical History:  Diagnosis Date   Anemia     Past Surgical History:  Procedure Laterality Date   ADENOIDECTOMY     pt denies   COCHLEAR IMPLANT Right 11/02/2014   LAPAROSCOPIC CHOLECYSTECTOMY     TYMPANOSTOMY TUBE PLACEMENT       reports that she has quit smoking. Her smoking use included cigarettes. She has never used smokeless tobacco. She reports that she does not drink alcohol and does not use drugs.  No Known Allergies  Family History  Problem Relation Age of Onset   Hypertension Mother    Diabetes Mother    Healthy Father     Prior to Admission medications   Medication Sig Start Date End Date Taking? Authorizing Provider  acetaminophen  (TYLENOL ) 325 MG tablet Take 2 tablets (650 mg total) by mouth every 6 (six) hours as needed. 12/31/20   Owen Blowers P, DO  ferrous sulfate  325 (65 FE) MG tablet Take 1 tablet (325 mg total) by mouth every other day. 06/18/23   Cooleen, Darren Em, NP  predniSONE (DELTASONE) 1 MG tablet Take 5 mg by mouth as needed. Currently not taking every day since on injections    [provider]  Prenatal Vit-Fe Fumarate-FA (PRENATAL MULTIVITAMIN) TABS tablet Take 1 tablet by mouth daily at 12 noon.    [provider]  promethazine  (PHENERGAN ) 12.5 MG tablet 1 tablet every 6 hours as needed for headache. 06/24/15   Lowell Rude, MD     Physical Exam: Vitals:   09/08/23 2100 09/08/23 2140 09/08/23 2145 09/08/23 2236  BP: 102/60  113/62 101/64  Pulse: (!) 135  (!) 134 (!) 131  Resp: (!) 25  16   Temp:  99.8 F (37.7 C)  99 F (37.2 C)  TempSrc:  Oral  Oral  SpO2: 94%  96% 95%  Weight:        Physical Exam Vitals and nursing note reviewed.  Constitutional:       Appearance: She is obese. She is ill-appearing.  HENT:     Mouth/Throat:     Mouth: Mucous membranes are dry.  Eyes:     Pupils: Pupils are equal, round, and reactive to light.  Cardiovascular:     Rate and Rhythm: Regular rhythm. Tachycardia present.     Pulses: Normal pulses.     Heart sounds: Normal heart sounds.  Pulmonary:     Effort: Pulmonary effort is normal.     Breath sounds: Normal breath sounds. No wheezing, rhonchi or rales.  Abdominal:     General: There is no distension.     Palpations: Abdomen is soft.  Tenderness: There is no abdominal tenderness. There is no guarding or rebound.  Musculoskeletal:     Cervical back: Neck supple.  Skin:    General: Skin is warm and dry.     Capillary Refill: Capillary refill takes less than 2 seconds.  Neurological:     Mental Status: She is oriented to person, place, and time.  Psychiatric:        Mood and Affect: Mood normal.        Thought Content: Thought content normal.      Labs on Admission: I have personally reviewed following labs and imaging studies  CBC: Recent Labs  Lab 09/08/23 1548  WBC 17.4*  NEUTROABS 15.6*  HGB 11.5*  HCT 39.0  MCV 77.8*  PLT 349   Basic Metabolic Panel: Recent Labs  Lab 09/08/23 1548  NA 135  K 4.0  CL 106  CO2 19*  GLUCOSE 109*  BUN 13  CREATININE 0.73  CALCIUM 8.5*   GFR: Estimated Creatinine Clearance: 109.7 mL/min (by C-G formula based on SCr of 0.73 mg/dL). Liver Function Tests: Recent Labs  Lab 09/08/23 1548  AST 65*  ALT 139*  ALKPHOS 113  BILITOT 0.3  PROT 6.9  ALBUMIN 3.5   No results for input(s): "LIPASE", "AMYLASE" in the last 168 hours. No results for input(s): "AMMONIA" in the last 168 hours. Coagulation Profile: No results for input(s): "INR", "PROTIME" in the last 168 hours. Cardiac Enzymes: No results for input(s): "CKTOTAL", "CKMB", "CKMBINDEX", "TROPONINI", "TROPONINIHS" in the last 168 hours. BNP (last 3 results) No results for  input(s): "BNP" in the last 8760 hours. HbA1C: No results for input(s): "HGBA1C" in the last 72 hours. CBG: No results for input(s): "GLUCAP" in the last 168 hours. Lipid Profile: No results for input(s): "CHOL", "HDL", "LDLCALC", "TRIG", "CHOLHDL", "LDLDIRECT" in the last 72 hours. Thyroid  Function Tests: No results for input(s): "TSH", "T4TOTAL", "FREET4", "T3FREE", "THYROIDAB" in the last 72 hours. Anemia Panel: No results for input(s): "VITAMINB12", "FOLATE", "FERRITIN", "TIBC", "IRON", "RETICCTPCT" in the last 72 hours. Urine analysis:    Component Value Date/Time   COLORURINE YELLOW 09/08/2023 1758   APPEARANCEUR CLEAR 09/08/2023 1758   LABSPEC 1.014 09/08/2023 1758   PHURINE 7.0 09/08/2023 1758   GLUCOSEU NEGATIVE 09/08/2023 1758   HGBUR NEGATIVE 09/08/2023 1758   BILIRUBINUR NEGATIVE 09/08/2023 1758   KETONESUR NEGATIVE 09/08/2023 1758   PROTEINUR NEGATIVE 09/08/2023 1758   UROBILINOGEN 1.0 01/16/2013 1927   NITRITE NEGATIVE 09/08/2023 1758   LEUKOCYTESUR NEGATIVE 09/08/2023 1758    Radiological Exams on Admission: I have personally reviewed images CT ABDOMEN PELVIS W CONTRAST Result Date: 09/08/2023 EXAM: CT ABDOMEN AND PELVIS WITH CONTRAST 09/08/2023 07:34:15 PM TECHNIQUE: CT of the abdomen and pelvis was performed with 75 mL of iohexol  (OMNIPAQUE ) 350 MG/ML injection. Multiplanar reformatted images are provided for review. Automated exposure control, iterative reconstruction, and/or weight based adjustment of the mA/kV was utilized to reduce the radiation dose to as low as reasonably achievable. COMPARISON: CT abdomen/pelvis 05/31/2021. CLINICAL HISTORY: Abdominal pain, acute, nonlocalized. ABD pain, back pain, headache, and generalized bodyaches that started today. Pt did have tubal ligation 4 days ago. FINDINGS: LOWER CHEST: Multifocal patchy/nodular opacities in the visualized lung bases, including a 3.1 cm mass-like opacity in the right lower lobe which is incompletely  visualized (image 1). These findings are likely all infectious. HEPATOBILIARY: Liver is within normal limits. Status post cholecystectomy. No biliary ductal dilatation. SPLEEN: No acute abnormality. PANCREAS: No acute abnormality. ADRENAL GLANDS: No acute abnormality.  KIDNEYS, URETERS: No stones in the kidneys or ureters. No evidence of hydronephrosis. No evidence of perinephric or periureteral stranding. GI AND BOWEL: Stomach demonstrates no acute abnormality. There is no evidence of bowel obstruction. No evidence of appendicitis. PELVIS: Urinary bladder is unremarkable. Heterogeneous appearance of the myometrium in the posterior uterine body (sagittal image 83). A prior IUD is no longer visualized. PERITONEUM AND RETROPERITONEUM: No evidence of free fluid or free air. LYMPH NODES: No evidence of lymphadenopathy. BONES AND SOFT TISSUES: No acute osseous abnormality. No focal soft tissue abnormality. Incidental adrenal and/or renal findings do not require follow up imaging. IMPRESSION: 1. Multifocal pneumonia, right lower lobe predominant, incompletely visualized. Consider follow-up chest radiographs in 4-6 weeks to document clearance. 2. Heterogeneous appearance of the posterior uterine myometrium, poorly evaluated. Consider pelvic ultrasound for further evaluation, as clinically warranted. Electronically signed by: Zadie Herter MD 09/08/2023 09:25 PM EDT RP Workstation: [id number]   DG Chest Portable 1 View Result Date: 09/08/2023 CLINICAL DATA:  Cough, back pain EXAM: PORTABLE CHEST 1 VIEW COMPARISON:  07/02/2022 FINDINGS: Single frontal view of the chest demonstrates an unremarkable cardiac silhouette. Patchy consolidation at the lung bases, right greater than left, consistent with airspace disease or atelectasis. No effusion or pneumothorax. No acute bony abnormalities. IMPRESSION: 1. Patchy bibasilar consolidation, which could reflect hypoventilatory changes or bibasilar pneumonia. Electronically Signed    By: Bobbye Burrow M.D.   On: 09/08/2023 17:55     EKG: My personal interpretation of EKG shows: Sinus tachycardia heart rate 05/19/1958    Assessment/Plan: Principal Problem:   Sepsis due to pneumonia [facility]) Active Problems:   Anemia of chronic disease   H/O tubal ligation- 09/04/2023   Rheumatoid arthritis (HCC)    Assessment and Plan: Sepsis secondary to pneumonia Postop pneumonia -Patient presented emergency department with sudden onset of abdominal pain, back pain, headache, generalized body ache that started acutely today.  Per chart review patient has recent BTS 4 days ago at [facility].  Patient is also complaining about nausea, cough and recent sick contact from her children. - At presentation to ED patient is tachycardic, hypotensive, febrile, repeat lactic acid 2.2 improved to 1.7. - Chest x-ray showed evidence of multifocal pneumonia - Respiratory panel negative. Blood cultures are pending.  UA unremarkable. - CT abdomen pelvis showed multifocal pneumonia.  Heterogeneous appearance of the posterior uterine myometrium, poorly evaluated. Consider pelvic ultrasound for further evaluation, as clinically warranted. -In the ED patient has been already evaluated by OB/GYN Dr. Vallarie Gauze there is no concern for source of infection from recent tubal ligation or uterine origin.  Recommended to treat for pneumonia and OB/GYN signed off. -In the ED code sepsis has been activated.  Patient has been resuscitated with 3 L of LR lactic acid has been improved tachycardia's has been improved.  Patient received metronidazole and cefepime. - Plan to continue IV vancomycin and cefepime given patient has recent hospitalization for tubal ligation and immunocompromise status from rheumatoid arthritis and chronic immunosuppression. - Checking respiratory culture, urine Legionella and urine strep antigen test. - Will continue broad-spectrum antibiotic coverage until culture results will be available to  narrow down. -Continue LR at 150 cc/h.   Recent bilateral tubal ligation -Patient has recent bilateral [facility].  Recovering well. CT abdomen pelvis showed multifocal pneumonia.  Heterogeneous appearance of the posterior uterine myometrium, poorly evaluated. Consider pelvic ultrasound for further evaluation, as clinically warranted. -In the ED patient has been already evaluated by OB/GYN Dr. Vallarie Gauze there  is no concern for source of infection from recent tubal ligation or uterine origin.  Recommended to treat for pneumonia and OB/GYN signed off. -Continue pain control.   History of rheumatoid arthritis -Patient follows rheumatology with [facility].  Outpatient patient has been treated with Plaquenil 400mg  daily,  prednisone 5 mg daily and certolizumab pegol (CIMZIA) 400 mg/2 mL biweekly. -Continue prednisone. -Holding Plaquenil in the setting of sepsis due to pneumonia.  Anemia of chronic disease -Continue iron supplement   DVT prophylaxis:  Lovenox Code Status:  Full Code Diet: Heart healthy diet Family Communication:   Family was present at bedside, at the time of interview. Opportunity was given to ask question and all questions were answered satisfactorily.  Disposition Plan: Need to narrow down antibiotic based on the culture results. Consults: OB/GYN Admission status:   Inpatient, Telemetry bed  Severity of Illness: The appropriate patient status for this patient is INPATIENT. Inpatient status is judged to be reasonable and necessary in order to provide the required intensity of service to ensure the patient's safety. The patient's presenting symptoms, physical exam findings, and initial radiographic and laboratory data in the context of their chronic comorbidities is felt to place them at high risk for further clinical deterioration. Furthermore, it is not anticipated that the patient will be medically stable for discharge from the hospital within 2  midnights of admission.   * I certify that at the point of admission it is my clinical judgment that the patient will require inpatient hospital care spanning beyond 2 midnights from the point of admission due to high intensity of service, high risk for further deterioration and high frequency of surveillance required.Aaron Aas    Ivee Poellnitz, MD [facility]  How to contact the [facility] Attending or Consulting provider 7A - 7P or covering provider during after hours 7P -7A, for this patient.  Check the care team in [facility] and look for a) attending/consulting [facility] provider listed and b) the [facility] team listed Log into [email or web address] and use [facility] universal password to access. If you do not have the password, please contact the hospital operator. Locate the [facility] provider you are looking for under [facility] and page to a number that you can be directly reached. If you still have difficulty reaching the provider, please page the [facility] (Director on Call) for the Hospitalists listed on amion for assistance.  09/08/2023, 10:43 PM

## 2023-09-08 NOTE — ED Provider Notes (Signed)
 [facility]ERGENCY DEPARTMENT AT [facility] Provider Note  CSN: [id number] Arrival date & time: 09/08/23 1547  Chief Complaint(s) Multiple Complaints  HPI Theresa Todd is a 23 y.o. female who is here today for perceived fevers, all of her myalgias that seem to occur suddenly around 12 PM this afternoon.  Patient reports feeling allover body pain, pain in her lower abdomen and back.  Patient had tubal ligation 4 days ago at [facility].  She has not had any vomiting or diarrhea.   Past Medical History Past Medical History:  Diagnosis Date   Anemia    Patient Active Problem List   Diagnosis Date Noted   Migraine without aura and without status migrainosus, not intractable 08/07/2015   Neuropathic pain 06/24/2015   COUGH 03/07/2007   ANEMIA, OTHER, UNSPECIFIED 07/12/2006   Hearing loss 07/12/2006   Home Medication(s) Prior to Admission medications   Medication Sig Start Date End Date Taking? Authorizing Provider  acetaminophen  (TYLENOL ) 325 MG tablet Take 2 tablets (650 mg total) by mouth every 6 (six) hours as needed. 12/31/20   Owen Blowers P, DO  ferrous sulfate  325 (65 FE) MG tablet Take 1 tablet (325 mg total) by mouth every other day. 06/18/23   Cooleen, Darren Em, NP  predniSONE (DELTASONE) 1 MG tablet Take 5 mg by mouth as needed. Currently not taking every day since on injections    [provider]  Prenatal Vit-Fe Fumarate-FA (PRENATAL MULTIVITAMIN) TABS tablet Take 1 tablet by mouth daily at 12 noon.    [provider]  promethazine  (PHENERGAN ) 12.5 MG tablet 1 tablet every 6 hours as needed for headache. 06/24/15   Lowell Rude, MD                                                                                                                                    Past Surgical History Past Surgical History:  Procedure Laterality Date   ADENOIDECTOMY     pt denies   COCHLEAR IMPLANT Right 11/02/2014   LAPAROSCOPIC CHOLECYSTECTOMY      TYMPANOSTOMY TUBE PLACEMENT     Family History Family History  Problem Relation Age of Onset   Hypertension Mother    Diabetes Mother    Healthy Father     Social History Social History   Tobacco Use   Smoking status: Former    Types: Cigarettes   Smokeless tobacco: Never   Tobacco comments:    Quit 2024, prior to [employer] Use   Vaping status: Never Used  Substance Use Topics   Alcohol use: No   Drug use: No   Allergies Patient has no known allergies.  Review of Systems Review of Systems  Physical Exam Vital Signs  I have reviewed the triage vital signs BP 124/69   Pulse (!) 153   Temp (!) 97.4 F (36.3 C) (Temporal)   Resp (!) 22  Wt 88.5 kg   LMP 06/17/2022   SpO2 96%   Breastfeeding Unknown   BMI 36.84 kg/m   Physical Exam Vitals reviewed.  Constitutional:      Appearance: She is not toxic-appearing.  HENT:     Mouth/Throat:     Mouth: Mucous membranes are moist.  Eyes:     Pupils: Pupils are equal, round, and reactive to light.  Cardiovascular:     Rate and Rhythm: Tachycardia present.     Pulses: Normal pulses.  Pulmonary:     Effort: Pulmonary effort is normal.  Abdominal:     General: Abdomen is flat.     Palpations: Abdomen is soft.     Tenderness: There is abdominal tenderness. There is guarding.  Skin:    General: Skin is warm.  Neurological:     General: No focal deficit present.     Mental Status: She is alert.     ED Results and Treatments Labs (all labs ordered are listed, but only abnormal results are displayed) Labs Reviewed  RESP PANEL BY RT-PCR (RSV, FLU A&B, COVID)  RVPGX2  CULTURE, BLOOD (ROUTINE X 2)  CULTURE, BLOOD (ROUTINE X 2)  COMPREHENSIVE METABOLIC PANEL WITH GFR  CBC WITH DIFFERENTIAL/PLATELET  HCG, QUANTITATIVE, PREGNANCY  URINALYSIS, W/ REFLEX TO CULTURE (INFECTION SUSPECTED)  LACTIC ACID, PLASMA  LACTIC ACID, PLASMA                                                                                                                           Radiology No results found.  Pertinent labs & imaging results that were available during my care of the patient were reviewed by me and considered in my medical decision making (see MDM for details).  Medications Ordered in ED Medications  morphine (PF) 4 MG/ML injection 4 mg (has no administration in time range)  acetaminophen  (TYLENOL ) tablet 1,000 mg (has no administration in time range)  lactated ringers bolus 1,000 mL (has no administration in time range)  ceFEPIme (MAXIPIME) 2 g in sodium chloride  0.9 % 100 mL IVPB (has no administration in time range)  metroNIDAZOLE (FLAGYL) IVPB 500 mg (has no administration in time range)                                                                                                                                     Procedures .Critical Care  Performed  by: Nathanael Baker, DO Authorized by: Nathanael Baker, DO   Critical care provider statement:    Critical care time (minutes):  88   Critical care was necessary to treat or prevent imminent or life-threatening deterioration of the following conditions:  Sepsis   Critical care was time spent personally by me on the following activities:  Development of treatment plan with patient or surrogate, discussions with consultants, evaluation of patient's response to treatment, examination of patient, ordering and review of laboratory studies, ordering and review of radiographic studies, ordering and performing treatments and interventions, pulse oximetry, re-evaluation of patient's condition and review of old charts   (including critical care time)  Medical Decision Making / ED Course   This patient presents to the ED for concern of myalgias, abdominal pain, this involves an extensive number of treatment options, and is a complaint that carries with it a high risk of complications and morbidity.  The differential diagnosis includes intra-abdominal infection,  sepsis, bacteremia, less likely PE, viral syndrome, UTI, pyelonephritis  MDM: Patient tachycardic on arrival, does appear uncomfortable.  She is having lower abdominal pain.  Will obtain imaging of the patient's abdomen and pelvis.  Treating the patient as potential sepsis.  Is 4 days postop, could be urinary tract infection or pneumonia.  Will start the patient on broad-spectrum antibiotics.  With constellation of patient's symptoms, I believe thromboembolic pathology such as PE is less likely.  Patient without any chest pain or shortness of breath.  Reassessment 9:20 PM-patient with multifocal pneumonia.  Did reach out to OB/GYN here, Dr. Vallarie Gauze looked at the patient's imaging and based on story did not believe this was related to patient's recent tubal ligation.  With an alternative source of infection, I agree.  Will admit for sepsis, pneumonia.  I reevaluated the patient following 3 L of IV fluids.  Lactic acid downtrending.   Additional history obtained: -Additional history obtained from family at bedside -External records from outside source obtained and reviewed including: Chart review including previous notes, labs, imaging, consultation notes   Lab Tests: -I ordered, reviewed, and interpreted labs.   The pertinent results include:   Labs Reviewed  RESP PANEL BY RT-PCR (RSV, FLU A&B, COVID)  RVPGX2  CULTURE, BLOOD (ROUTINE X 2)  CULTURE, BLOOD (ROUTINE X 2)  COMPREHENSIVE METABOLIC PANEL WITH GFR  CBC WITH DIFFERENTIAL/PLATELET  HCG, QUANTITATIVE, PREGNANCY  URINALYSIS, W/ REFLEX TO CULTURE (INFECTION SUSPECTED)  LACTIC ACID, PLASMA  LACTIC ACID, PLASMA      EKG sinus tachycardia  EKG Interpretation Date/Time:    Ventricular Rate:    PR Interval:    QRS Duration:    QT Interval:    QTC Calculation:   R Axis:      Text Interpretation:           Imaging Studies ordered: I ordered imaging studies including CT imaging of the abdomen pelvis, chest x-ray I  independently visualized and interpreted imaging. I agree with the radiologist interpretation   Medicines ordered and prescription drug management: Meds ordered this encounter  Medications   morphine (PF) 4 MG/ML injection 4 mg   acetaminophen  (TYLENOL ) tablet 1,000 mg   lactated ringers bolus 1,000 mL   ceFEPIme (MAXIPIME) 2 g in sodium chloride  0.9 % 100 mL IVPB    Antibiotic Indication::   Sepsis   metroNIDAZOLE (FLAGYL) IVPB 500 mg    Antibiotic Indication::   Intra-abdominal Infection    -I have reviewed the patients home medicines and have made  adjustments as needed  Critical interventions Management of sepsis  Consultations Obtained: I requested consultation with the OB/GYN,  and discussed lab and imaging findings as well as pertinent plan - they recommend: Nothing from obstetrical standpoint   Cardiac Monitoring: The patient was maintained on a cardiac monitor.  I personally viewed and interpreted the cardiac monitored which showed an underlying rhythm of: Sinus tachycardia  Social Determinants of Health:  Factors impacting patients care include:    Reevaluation: After the interventions noted above, I reevaluated the patient and found that they have :improved  Co morbidities that complicate the patient evaluation  Past Medical History:  Diagnosis Date   Anemia       Dispostion: Admission     Final Clinical Impression(s) / ED Diagnoses Final diagnoses:  None     @PCDICTATION @    Afton Horse T, DO 09/08/23 2128

## 2023-09-08 NOTE — Consult Note (Signed)
   OB/GYN Telephone Consult  Theresa Todd is a 23 y.o. [phone] presenting with lower abdominal pain, back pain, headache and body aches starting acutely today. She had a BTS done at outside hospital 4 days ago with [facility] (Dr. Aneta Bar). She also noted nausea, cough and recent sick contacts from  her children.    I was called for a consult regarding the care of this patient by [facility].    The provider had a clinical question regarding appearance of uterus on CT.    The provider presented the following relevant clinical information and I performed a chart review on the patient and reviewed available documentation:  Fever 102.2, tachycardia with HR 130s, tachypneic with RR 22-36  Patient is a G4P4 who delivered in February. She had 4 prior SVDs. She had recent negative GC/CT testing during the pregnancy which was negative.   Dr. Florie Husband reports her incisions are well healed without evidence of infection.   I reviewed her labs WBC 18.7  Lactic acid 2.0 UA negative HCG negative AST 65 ALT 139 Cr 0.73  I reviewed her pelvic CT images independently and my findings are: The uterus overall looks normal given recent pregnancy and still being within the postpartum time period. Dr. Florie Husband and I discussed that CT is not the best modality for imaging of the uterus but EL is normal at 13 mm and her overall uterine size of 10x5 is normal, best visualized in the sagittal view. Otherwise, would await final read by radiology.   I reviewed CareEverywhere:  GC/CT was negative on 06/11/23 3/27: WBC 9.1, HgB 10.7   BP 103/63   Pulse (!) 139   Temp (!) 102.2 F (39 C) (Oral)   Resp 18   Wt 88.5 kg   LMP 06/17/2022   SpO2 96%   Breastfeeding Unknown   BMI 36.84 kg/m   Exam- performed by consulting provider   Recommendations:  - Postop from tubal ligation  (versus salpingectomy) -- no source at this time for infection related to surgery. Incisions are c/d/I and no  obvious findings from CT (although final report pending).  - Possible PNA according the CXR. Dr. Florie Husband plans to treat presumptively for this at this time and admit patient - Reviewed if something else shows up on testing or no improvement on ABX regimen, please reconsult us .   Thank you for this consult and if additional recommendations are needed please call [phone] for the OB/GYN attending on service at [facility].   I spent approximately 5 minutes directly consulting with the provider and verbally discussing this case. Additionally 10 minutes minutes was spent performing chart review and documentation.   Lacey Pian, MD Attending Obstetrician & Gynecologist, [facility] for [facility], [facility]

## 2023-09-08 NOTE — ED Triage Notes (Signed)
 Pt BIB EMS with c/o ABD pain, back pain, headache, and generalized bodyaches that started today. Pt did have tubal ligation 4 days ago. Per EMS, after movement pts HR increased to 150-160s. Pt endorses nausea, cough, and her kids have been sick recently.

## 2023-09-09 DIAGNOSIS — J189 Pneumonia, unspecified organism: Secondary | ICD-10-CM | POA: Diagnosis not present

## 2023-09-09 DIAGNOSIS — A419 Sepsis, unspecified organism: Secondary | ICD-10-CM | POA: Diagnosis not present

## 2023-09-09 LAB — CBC
HCT: 32.2 % — ABNORMAL LOW (ref 36.0–46.0)
Hemoglobin: 10.1 g/dL — ABNORMAL LOW (ref 12.0–15.0)
MCH: 23.1 pg — ABNORMAL LOW (ref 26.0–34.0)
MCHC: 31.4 g/dL (ref 30.0–36.0)
MCV: 73.7 fL — ABNORMAL LOW (ref 80.0–100.0)
Platelets: 355 10*3/uL (ref 150–400)
RBC: 4.37 MIL/uL (ref 3.87–5.11)
RDW: 17.8 % — ABNORMAL HIGH (ref 11.5–15.5)
WBC: 26.7 10*3/uL — ABNORMAL HIGH (ref 4.0–10.5)
nRBC: 0 % (ref 0.0–0.2)

## 2023-09-09 LAB — COMPREHENSIVE METABOLIC PANEL WITH GFR
ALT: 110 U/L — ABNORMAL HIGH (ref 0–44)
AST: 43 U/L — ABNORMAL HIGH (ref 15–41)
Albumin: 2.8 g/dL — ABNORMAL LOW (ref 3.5–5.0)
Alkaline Phosphatase: 117 U/L (ref 38–126)
Anion gap: 10 (ref 5–15)
BUN: 10 mg/dL (ref 6–20)
CO2: 24 mmol/L (ref 22–32)
Calcium: 8.2 mg/dL — ABNORMAL LOW (ref 8.9–10.3)
Chloride: 103 mmol/L (ref 98–111)
Creatinine, Ser: 0.73 mg/dL (ref 0.44–1.00)
GFR, Estimated: >60 mL/min
Glucose, Bld: 117 mg/dL — ABNORMAL HIGH (ref 70–99)
Potassium: 4 mmol/L (ref 3.5–5.1)
Sodium: 137 mmol/L (ref 135–145)
Total Bilirubin: 0.6 mg/dL (ref 0.0–1.2)
Total Protein: 5.9 g/dL — ABNORMAL LOW (ref 6.5–8.1)

## 2023-09-09 LAB — MRSA NEXT GEN BY PCR, NASAL: MRSA by PCR Next Gen: NOT DETECTED

## 2023-09-09 LAB — PROCALCITONIN: Procalcitonin: 20.69 ng/mL

## 2023-09-09 LAB — STREP PNEUMONIAE URINARY ANTIGEN: Strep Pneumo Urinary Antigen: NEGATIVE

## 2023-09-09 LAB — HIV ANTIBODY (ROUTINE TESTING W REFLEX): HIV Screen 4th Generation wRfx: NONREACTIVE

## 2023-09-09 MED ORDER — PIPERACILLIN-TAZOBACTAM 3.375 G IVPB
3.3750 g | Freq: Three times a day (TID) | INTRAVENOUS | Status: DC
  Administered 2023-09-09 – 2023-09-11 (×6): 3.375 g via INTRAVENOUS
  Filled 2023-09-09 (×6): qty 50

## 2023-09-09 MED ORDER — MENTHOL 3 MG MT LOZG
1.0000 | LOZENGE | OROMUCOSAL | Status: DC | PRN
  Administered 2023-09-09: 3 mg via ORAL
  Filled 2023-09-09: qty 9

## 2023-09-09 MED ORDER — PIPERACILLIN-TAZOBACTAM 3.375 G IVPB 30 MIN: 3.3750 g | Freq: Three times a day (TID) | INTRAVENOUS | Status: DC

## 2023-09-09 MED ORDER — DIPHENHYDRAMINE HCL 50 MG/ML IJ SOLN
25.0000 mg | Freq: Four times a day (QID) | INTRAMUSCULAR | Status: DC | PRN
Start: 2023-09-09 — End: 2023-09-11
  Administered 2023-09-09 (×3): 25 mg via INTRAVENOUS
  Filled 2023-09-09 (×4): qty 1

## 2023-09-09 MED ORDER — MORPHINE SULFATE (PF) 2 MG/ML IV SOLN
1.0000 mg | INTRAVENOUS | Status: DC | PRN
  Administered 2023-09-09 (×4): 1 mg via INTRAVENOUS
  Filled 2023-09-09 (×4): qty 1

## 2023-09-09 NOTE — Plan of Care (Signed)
 Pt alert and oriented x 4. Pt talking on the phone. IVF's infusing w/o difficulty. Stated that her headache is returning and her throat is sore. Congested non productive cough noted. Afebrile. Encouraged to call for assistance as needed. Call light in reach. Sr x 2 elevated. Bed in low position.

## 2023-09-09 NOTE — Progress Notes (Signed)
  Progress Note   Patient: Theresa Todd WUJ:[id number] DOB: November 24, 2000 DOA: 09/08/2023     1 DOS: the patient was seen and examined on 09/09/2023   Assessment and Plan: Sepsis due to pneumonia  - IV LR 75 cc/hr  - IV zosyn 3.375 q8hr  - MRSA swab negative --> DC vanco   Recent b/l tubal ligation  - Appreciate OBGYN consult  - Norco PRN  - IV morphine 1 mg q2 hr PRN  - Colace 100 mg PO bid   Hx of RA  - Prednisone 5 mg PO daily  - Protonix 40 mg PO daily   Anemia of chronic disease  - Ferrous sulfate  325 mg PO every other day   Subjective: Pt seen and examined at the bedside. IV zosyn started today for better anerobic coverage as the pt's WBC has risen from 17 -->26 & cefepime stopped. MRSA swab negative and vanco stopped.  Physical Exam: Vitals:   09/08/23 2145 09/08/23 2236 09/09/23 0418 09/09/23 0838  BP: 113/62 101/64 102/65 129/76  Pulse: (!) 134 (!) 131 (!) 109 (!) 116  Resp: 16 18  19   Temp:  99 F (37.2 C) 99.1 F (37.3 C) 99.3 F (37.4 C)  TempSrc:  Oral Oral Oral  SpO2: 96% 95% 98% 97%  Weight:       Physical Exam HENT:     Head: Normocephalic.     Mouth/Throat:     Mouth: Mucous membranes are moist.  Cardiovascular:     Rate and Rhythm: Tachycardia present.  Pulmonary:     Effort: Pulmonary effort is normal.  Abdominal:     Palpations: Abdomen is soft.  Musculoskeletal:        General: Normal range of motion.  Skin:    General: Skin is warm.  Neurological:     Mental Status: She is alert and oriented to person, place, and time.  Psychiatric:        Mood and Affect: Mood normal.      Disposition: Status is: Inpatient Remains inpatient appropriate because: IV antibx and serial labs   Planned Discharge Destination: Home    Time spent: 35 minutes  Author: Cortney Beissel , MD 09/09/2023 9:26 AM  For on call review [email or web address].

## 2023-09-10 DIAGNOSIS — A419 Sepsis, unspecified organism: Secondary | ICD-10-CM | POA: Diagnosis not present

## 2023-09-10 DIAGNOSIS — J189 Pneumonia, unspecified organism: Secondary | ICD-10-CM | POA: Diagnosis not present

## 2023-09-10 LAB — CBC
HCT: 30 % — ABNORMAL LOW (ref 36.0–46.0)
Hemoglobin: 9.2 g/dL — ABNORMAL LOW (ref 12.0–15.0)
MCH: 22.7 pg — ABNORMAL LOW (ref 26.0–34.0)
MCHC: 30.7 g/dL (ref 30.0–36.0)
MCV: 74.1 fL — ABNORMAL LOW (ref 80.0–100.0)
Platelets: 348 10*3/uL (ref 150–400)
RBC: 4.05 MIL/uL (ref 3.87–5.11)
RDW: 18.2 % — ABNORMAL HIGH (ref 11.5–15.5)
WBC: 18.2 10*3/uL — ABNORMAL HIGH (ref 4.0–10.5)
nRBC: 0 % (ref 0.0–0.2)

## 2023-09-10 LAB — COMPREHENSIVE METABOLIC PANEL WITH GFR
ALT: 82 U/L — ABNORMAL HIGH (ref 0–44)
AST: 21 U/L (ref 15–41)
Albumin: 2.8 g/dL — ABNORMAL LOW (ref 3.5–5.0)
Alkaline Phosphatase: 107 U/L (ref 38–126)
Anion gap: 10 (ref 5–15)
BUN: 9 mg/dL (ref 6–20)
CO2: 23 mmol/L (ref 22–32)
Calcium: 8.4 mg/dL — ABNORMAL LOW (ref 8.9–10.3)
Chloride: 104 mmol/L (ref 98–111)
Creatinine, Ser: 0.65 mg/dL (ref 0.44–1.00)
GFR, Estimated: >60 mL/min (ref >60)
Glucose, Bld: 91 mg/dL (ref 70–99)
Potassium: 3.9 mmol/L (ref 3.5–5.1)
Sodium: 137 mmol/L (ref 135–145)
Total Bilirubin: 0.3 mg/dL (ref 0.0–1.2)
Total Protein: 6.2 g/dL — ABNORMAL LOW (ref 6.5–8.1)

## 2023-09-10 LAB — C-REACTIVE PROTEIN: CRP: 17.8 mg/dL — ABNORMAL HIGH (ref <1.0)

## 2023-09-10 LAB — MAGNESIUM: Magnesium: 1.9 mg/dL (ref 1.7–2.4)

## 2023-09-10 LAB — PHOSPHORUS: Phosphorus: 3.3 mg/dL (ref 2.5–4.6)

## 2023-09-10 MED ORDER — HYDROXYZINE HCL 25 MG PO TABS
25.0000 mg | ORAL_TABLET | Freq: Once | ORAL | Status: AC
  Administered 2023-09-10: 25 mg via ORAL
  Filled 2023-09-10: qty 1

## 2023-09-10 MED ORDER — POLYETHYLENE GLYCOL 3350 17 G PO PACK
17.0000 g | PACK | Freq: Every day | ORAL | Status: DC | PRN
Start: 2023-09-10 — End: 2023-09-11
  Administered 2023-09-10: 17 g via ORAL
  Filled 2023-09-10: qty 1

## 2023-09-10 NOTE — Progress Notes (Signed)
 Pt resting at present. prn meds  effective this shift. Tolerating IV antibiotics. Call light in reach. Sr x2 elevated. Bed in low position.

## 2023-09-10 NOTE — Plan of Care (Signed)
 Patient AAOx4. Telemetry monitoring in progress. PRN pain medication given with + effect. Safety precautions maintained.    Problem: Education: Goal: Knowledge of General Education information will improve Description: Including pain rating scale, medication(s)/side effects and non-pharmacologic comfort measures Outcome: Progressing   Problem: Clinical Measurements: Goal: Will remain free from infection Outcome: Progressing   Problem: Nutrition: Goal: Adequate nutrition will be maintained Outcome: Progressing   Problem: Elimination: Goal: Will not experience complications related to bowel motility Outcome: Progressing   Problem: Pain Managment: Goal: General experience of comfort will improve and/or be controlled Outcome: Progressing

## 2023-09-10 NOTE — Progress Notes (Addendum)
  Progress Note   Patient: Theresa Todd [id number]:[id number] DOB: 08-13-2000 DOA: 09/08/2023     2 DOS: the patient was seen and examined on 09/10/2023  Assessment and Plan: Sepsis due to bacterial pneumonia  - IV zosyn 3.375 q8hr  - MRSA swab negative --> DC vanco    Recent b/l tubal ligation  - Appreciate OBGYN consult  - Norco PRN  - IV morphine 1 mg q2 hr PRN  - Colace 100 mg PO bid    Hx of RA  - Prednisone 5 mg PO daily  - Protonix 40 mg PO daily    Anemia of chronic disease  - Ferrous sulfate  325 mg PO every other day   5. 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 She is much improved today. WBC downtrended 26 -->18. Continue IV zosyn and serial labs. Likely DC tmr.  Physical Exam: Vitals:   09/09/23 2006 09/09/23 2350 09/10/23 0455 09/10/23 0751  BP: (!) 100/56 (!) 101/59 102/63 115/70  Pulse: 86 94 80 87  Resp: 18 18 16 18   Temp: 97.7 F (36.5 C) 98.6 F (37 C) 97.9 F (36.6 C) 98.1 F (36.7 C)  TempSrc: Oral Oral Oral Oral  SpO2: 98% 98% 98% 97%  Weight:       HENT:     Head: Normocephalic.     Mouth/Throat:     Mouth: Mucous membranes are moist.  Cardiovascular:     Rate and Rhythm:RRR  Pulmonary:     Effort: Pulmonary effort is normal.  Abdominal:     Palpations: Abdomen is soft.  Musculoskeletal:        General: Normal range of motion.  Skin:    General: Skin is warm.  Neurological:     Mental Status: She is alert and oriented to person, place, and time.  Psychiatric:        Mood and Affect: Mood normal.     Disposition: Status is: Inpatient Remains inpatient appropriate because: IV antibx and serial labs  Planned Discharge Destination: Home    Time spent: 35 minutes  Author: Osmara Drummonds , MD 09/10/2023 9:49 AM  For on call review [email or web address].

## 2023-09-11 DIAGNOSIS — A419 Sepsis, unspecified organism: Secondary | ICD-10-CM | POA: Diagnosis not present

## 2023-09-11 DIAGNOSIS — J189 Pneumonia, unspecified organism: Secondary | ICD-10-CM | POA: Diagnosis not present

## 2023-09-11 LAB — COMPREHENSIVE METABOLIC PANEL WITH GFR
ALT: 65 U/L — ABNORMAL HIGH (ref 0–44)
AST: 17 U/L (ref 15–41)
Albumin: 3.1 g/dL — ABNORMAL LOW (ref 3.5–5.0)
Alkaline Phosphatase: 105 U/L (ref 38–126)
Anion gap: 10 (ref 5–15)
BUN: 15 mg/dL (ref 6–20)
CO2: 23 mmol/L (ref 22–32)
Calcium: 8.7 mg/dL — ABNORMAL LOW (ref 8.9–10.3)
Chloride: 105 mmol/L (ref 98–111)
Creatinine, Ser: 0.79 mg/dL (ref 0.44–1.00)
GFR, Estimated: >60 mL/min (ref >60)
Glucose, Bld: 87 mg/dL (ref 70–99)
Potassium: 3.9 mmol/L (ref 3.5–5.1)
Sodium: 138 mmol/L (ref 135–145)
Total Bilirubin: 0.5 mg/dL (ref 0.0–1.2)
Total Protein: 6.9 g/dL (ref 6.5–8.1)

## 2023-09-11 LAB — CBC
HCT: 31.8 % — ABNORMAL LOW (ref 36.0–46.0)
Hemoglobin: 9.9 g/dL — ABNORMAL LOW (ref 12.0–15.0)
MCH: 22.8 pg — ABNORMAL LOW (ref 26.0–34.0)
MCHC: 31.1 g/dL (ref 30.0–36.0)
MCV: 73.1 fL — ABNORMAL LOW (ref 80.0–100.0)
Platelets: 400 10*3/uL (ref 150–400)
RBC: 4.35 MIL/uL (ref 3.87–5.11)
RDW: 18 % — ABNORMAL HIGH (ref 11.5–15.5)
WBC: 10.5 10*3/uL (ref 4.0–10.5)
nRBC: 0 % (ref 0.0–0.2)

## 2023-09-11 LAB — MAGNESIUM: Magnesium: 2 mg/dL (ref 1.7–2.4)

## 2023-09-11 LAB — C-REACTIVE PROTEIN: CRP: 8.4 mg/dL — ABNORMAL HIGH (ref <1.0)

## 2023-09-11 LAB — LEGIONELLA PNEUMOPHILA SEROGP 1 UR AG: L. pneumophila Serogp 1 Ur Ag: NEGATIVE

## 2023-09-11 LAB — PHOSPHORUS: Phosphorus: 4.5 mg/dL (ref 2.5–4.6)

## 2023-09-11 MED ORDER — AMOXICILLIN-POT CLAVULANATE 875-125 MG PO TABS: 1.0000 | ORAL_TABLET | Freq: Two times a day (BID) | ORAL | 0 refills | Status: AC

## 2023-09-11 NOTE — Discharge Summary (Signed)
 Physician Discharge Summary   Patient: Theresa Todd MRN: [id number] DOB: Oct 28, 2000  Admit date:     09/08/2023  Discharge date: 09/11/23  Discharge Physician: Trenace Coughlin    PCP: Patient, No Pcp Per     Discharge Diagnoses: Principal Problem:   Sepsis due to pneumonia [facility]) Active Problems:   Anemia of chronic disease   H/O tubal ligation- 09/04/2023   Rheumatoid arthritis (HCC)  Resolved Problems:   * No resolved hospital problems. *  Hospital Course: 23 yo F treated for sepsis due to bacterial pneumonia. Pt was initially on IV cefepime and IV vancomycin. Nasal MRSA swab was negative thus vancomycin was stopped. Cefepime was changed to IV zosyn because the pt's WBC was actually increasing.  On the IV zosyn the pt's WBC downtrended 26 -->10. CRP also improved 17 -->8.  She felt better on the morning of 09/11/2023 and was requesting discharge home. She will go home with 3 more days of PO augmentin to complete the course at home.   DISCHARGE MEDICATION: Allergies as of 09/11/2023   No Known Allergies      Medication List     STOP taking these medications    ibuprofen  800 MG tablet Commonly known as: ADVIL        TAKE these medications    acetaminophen  325 MG tablet Commonly known as: Tylenol  Take 2 tablets (650 mg total) by mouth every 6 (six) hours as needed.   amoxicillin-clavulanate 875-125 MG tablet Commonly known as: AUGMENTIN Take 1 tablet by mouth 2 (two) times daily for 3 days.   Cimzia (2 Syringe) 200 MG/ML prefilled syringe Generic drug: certolizumab pegol Inject 200 mg into the skin every 14 (fourteen) days.   ferrous sulfate  325 (65 FE) MG tablet Take 1 tablet (325 mg total) by mouth every other day.   HYDROcodone -acetaminophen  5-325 MG tablet Commonly known as: NORCO/VICODIN Take 1 tablet by mouth every 4 (four) hours as needed.   predniSONE 5 MG tablet Commonly known as: DELTASONE Take 5 mg by mouth daily as needed (arthritis). Currently not  taking every day since on injections   prenatal multivitamin Tabs tablet Take 1 tablet by mouth daily at 12 noon.        Discharge Exam: Filed Weights   09/08/23 1555  Weight: 88.5 kg   Physical Exam HENT:     Head: Normocephalic.     Mouth/Throat:     Mouth: Mucous membranes are moist.  Cardiovascular:     Rate and Rhythm: Normal rate and regular rhythm.  Pulmonary:     Effort: Pulmonary effort is normal.  Abdominal:     Palpations: Abdomen is soft.  Musculoskeletal:        General: Normal range of motion.     Cervical back: Neck supple.  Skin:    General: Skin is warm.  Neurological:     Mental Status: She is alert and oriented to person, place, and time.  Psychiatric:        Mood and Affect: Mood normal.      Condition at discharge: fair  The results of significant diagnostics from this hospitalization (including imaging, microbiology, ancillary and laboratory) are listed below for reference.   Imaging Studies: CT ABDOMEN PELVIS W CONTRAST Result Date: 09/08/2023 EXAM: CT ABDOMEN AND PELVIS WITH CONTRAST 09/08/2023 07:34:15 PM TECHNIQUE: CT of the abdomen and pelvis was performed with 75 mL of iohexol  (OMNIPAQUE ) 350 MG/ML injection. Multiplanar reformatted images are provided for review. Automated exposure control, iterative reconstruction, and/or  weight based adjustment of the mA/kV was utilized to reduce the radiation dose to as low as reasonably achievable. COMPARISON: CT abdomen/pelvis 05/31/2021. CLINICAL HISTORY: Abdominal pain, acute, nonlocalized. ABD pain, back pain, headache, and generalized bodyaches that started today. Pt did have tubal ligation 4 days ago. FINDINGS: LOWER CHEST: Multifocal patchy/nodular opacities in the visualized lung bases, including a 3.1 cm mass-like opacity in the right lower lobe which is incompletely visualized (image 1). These findings are likely all infectious. HEPATOBILIARY: Liver is within normal limits. Status post  cholecystectomy. No biliary ductal dilatation. SPLEEN: No acute abnormality. PANCREAS: No acute abnormality. ADRENAL GLANDS: No acute abnormality. KIDNEYS, URETERS: No stones in the kidneys or ureters. No evidence of hydronephrosis. No evidence of perinephric or periureteral stranding. GI AND BOWEL: Stomach demonstrates no acute abnormality. There is no evidence of bowel obstruction. No evidence of appendicitis. PELVIS: Urinary bladder is unremarkable. Heterogeneous appearance of the myometrium in the posterior uterine body (sagittal image 83). A prior IUD is no longer visualized. PERITONEUM AND RETROPERITONEUM: No evidence of free fluid or free air. LYMPH NODES: No evidence of lymphadenopathy. BONES AND SOFT TISSUES: No acute osseous abnormality. No focal soft tissue abnormality. Incidental adrenal and/or renal findings do not require follow up imaging. IMPRESSION: 1. Multifocal pneumonia, right lower lobe predominant, incompletely visualized. Consider follow-up chest radiographs in 4-6 weeks to document clearance. 2. Heterogeneous appearance of the posterior uterine myometrium, poorly evaluated. Consider pelvic ultrasound for further evaluation, as clinically warranted. Electronically signed by: Zadie Herter MD 09/08/2023 09:25 PM EDT RP Workstation: [id number]   DG Chest Portable 1 View Result Date: 09/08/2023 CLINICAL DATA:  Cough, back pain EXAM: PORTABLE CHEST 1 VIEW COMPARISON:  07/02/2022 FINDINGS: Single frontal view of the chest demonstrates an unremarkable cardiac silhouette. Patchy consolidation at the lung bases, right greater than left, consistent with airspace disease or atelectasis. No effusion or pneumothorax. No acute bony abnormalities. IMPRESSION: 1. Patchy bibasilar consolidation, which could reflect hypoventilatory changes or bibasilar pneumonia. Electronically Signed   By: Bobbye Burrow M.D.   On: 09/08/2023 17:55    Microbiology: Results for orders placed or performed during the  hospital encounter of 09/08/23  Resp panel by RT-PCR (RSV, Flu A&B, Covid) Anterior Nasal Swab     Status: None   Collection Time: 09/08/23  4:09 PM   Specimen: Anterior Nasal Swab  Result Value Ref Range Status   SARS Coronavirus 2 by RT PCR NEGATIVE NEGATIVE Final   Influenza A by PCR NEGATIVE NEGATIVE Final   Influenza B by PCR NEGATIVE NEGATIVE Final    Comment: (NOTE) The Xpert Xpress SARS-CoV-2/FLU/RSV plus assay is intended as an aid in the diagnosis of influenza from Nasopharyngeal swab specimens and should not be used as a sole basis for treatment. Nasal washings and aspirates are unacceptable for Xpert Xpress SARS-CoV-2/FLU/RSV testing.  Fact Sheet for Patients: BloggerCourse.com  Fact Sheet for Healthcare Providers: SeriousBroker.it  This test is not yet approved or cleared by the [place]  FDA and has been authorized for detection and/or diagnosis of SARS-CoV-2 by FDA under an Emergency Use Authorization (EUA). This EUA will remain in effect (meaning this test can be used) for the duration of the COVID-19 declaration under Section 564(b)(1) of the Act, 21 U.S.C. section 360bbb-3(b)(1), unless the authorization is terminated or revoked.     Resp Syncytial Virus by PCR NEGATIVE NEGATIVE Final    Comment: (NOTE) Fact Sheet for Patients: BloggerCourse.com  Fact Sheet for Healthcare Providers: SeriousBroker.it  This test is not yet  approved or cleared by the [place]  FDA and has been authorized for detection and/or diagnosis of SARS-CoV-2 by FDA under an Emergency Use Authorization (EUA). This EUA will remain in effect (meaning this test can be used) for the duration of the COVID-19 declaration under Section 564(b)(1) of the Act, 21 U.S.C. section 360bbb-3(b)(1), unless the authorization is terminated or revoked.  Performed at [facility], [address], [place], [place] [address]   Blood culture (routine x 2)     Status: None (Preliminary result)   Collection Time: 09/08/23  5:00 PM   Specimen: BLOOD LEFT HAND  Result Value Ref Range Status   Specimen Description BLOOD LEFT HAND  Final   Special Requests   Final    BOTTLES DRAWN AEROBIC AND ANAEROBIC Blood Culture adequate volume   Culture   Final    NO GROWTH 3 DAYS Performed at [facility], [address], [place], [place] [address]    Report Status PENDING  Incomplete  Blood culture (routine x 2)     Status: None (Preliminary result)   Collection Time: 09/08/23  5:05 PM   Specimen: BLOOD LEFT FOREARM  Result Value Ref Range Status   Specimen Description BLOOD LEFT FOREARM  Final   Special Requests   Final    BOTTLES DRAWN AEROBIC AND ANAEROBIC Blood Culture results may not be optimal due to an inadequate volume of blood received in culture bottles   Culture   Final    NO GROWTH 3 DAYS Performed at [facility], [address] [place], [place] [address]    Report Status PENDING  Incomplete  MRSA Next Gen by PCR, Nasal     Status: None   Collection Time: 09/08/23  9:53 PM   Specimen: Nasal Mucosa; Nasal Swab  Result Value Ref Range Status   MRSA by PCR Next Gen NOT DETECTED NOT DETECTED Final    Comment: (NOTE) The GeneXpert MRSA Assay (FDA approved for NASAL specimens only), is one component of a comprehensive MRSA colonization surveillance program. It is not intended to diagnose MRSA infection nor to guide or monitor treatment for MRSA infections. Test performance is not FDA approved in patients less than 12 years old. Performed at [facility], [address], [place], [place] [address]     Labs: CBC: Recent Labs  Lab 09/08/23 1548 09/09/23 0524 09/10/23 0443 09/11/23 0541  WBC 17.4* 26.7* 18.2* 10.5  NEUTROABS 15.6*  --   --   --   HGB 11.5* 10.1* 9.2* 9.9*  HCT 39.0 32.2* 30.0* 31.8*  MCV 77.8* 73.7* 74.1* 73.1*  PLT 349 355 348  400   Basic Metabolic Panel: Recent Labs  Lab 09/08/23 1548 09/09/23 0524 09/10/23 0443 09/11/23 0541  NA 135 137 137 138  K 4.0 4.0 3.9 3.9  CL 106 103 104 105  CO2 19* 24 23 23   GLUCOSE 109* 117* 91 87  BUN 13 10 9 15   CREATININE 0.73 0.73 0.65 0.79  CALCIUM 8.5* 8.2* 8.4* 8.7*  MG  --   --  1.9 2.0  PHOS  --   --  3.3 4.5   Liver Function Tests: Recent Labs  Lab 09/08/23 1548 09/09/23 0524 09/10/23 0443 09/11/23 0541  AST 65* 43* 21 17  ALT 139* 110* 82* 65*  ALKPHOS 113 117 107 105  BILITOT 0.3 0.6 0.3 0.5  PROT 6.9 5.9* 6.2* 6.9  ALBUMIN 3.5 2.8* 2.8* 3.1*   CBG:  No results for input(s): "GLUCAP" in the last 168 hours.  Discharge time spent: greater than 30 minutes.  Signed: Khadeejah Castner , MD [facility] 09/11/2023

## 2023-09-11 NOTE — Progress Notes (Signed)
 Patient AAOx4. PRN tylenol  given for HA. Discharged by SWAT nurse and walked to discharge lounge.

## 2023-09-11 NOTE — Plan of Care (Signed)
 Patient is A&O x 4. VSS, on room air. Sinus rhythm on the cardiac monitor. Pt asymptomatic. Ambulating independently in the room. Safety maintained. Will continue to monitor.   Problem: Education: Goal: Knowledge of General Education information will improve Description: Including pain rating scale, medication(s)/side effects and non-pharmacologic comfort measures Outcome: Progressing   Problem: Health Behavior/Discharge Planning: Goal: Ability to manage health-related needs will improve Outcome: Progressing   Problem: Clinical Measurements: Goal: Ability to maintain clinical measurements within normal limits will improve Outcome: Progressing Goal: Will remain free from infection Outcome: Progressing Goal: Diagnostic test results will improve Outcome: Progressing Goal: Respiratory complications will improve Outcome: Progressing Goal: Cardiovascular complication will be avoided Outcome: Progressing   Problem: Activity: Goal: Risk for activity intolerance will decrease Outcome: Progressing   Problem: Nutrition: Goal: Adequate nutrition will be maintained Outcome: Progressing   Problem: Coping: Goal: Level of anxiety will decrease Outcome: Progressing   Problem: Elimination: Goal: Will not experience complications related to bowel motility Outcome: Progressing Goal: Will not experience complications related to urinary retention Outcome: Progressing   Problem: Pain Managment: Goal: General experience of comfort will improve and/or be controlled Outcome: Progressing   Problem: Safety: Goal: Ability to remain free from injury will improve Outcome: Progressing   Problem: Skin Integrity: Goal: Risk for impaired skin integrity will decrease Outcome: Progressing

## 2023-09-13 LAB — CULTURE, BLOOD (ROUTINE X 2)
Culture: NO GROWTH
Culture: NO GROWTH
Special Requests: ADEQUATE

## 2023-10-01 NOTE — Unmapped (Signed)
 The [facility] has made a second and final attempt to reach this patient to refill the following medication:certolizumab pegol : CIMZIA  400 mg/2 mL (200 mg/mL x 2) Sykt injection syringe.      We have left voicemails on the following phone numbers: [phone] and have sent a text message to the following phone numbers: [phone].    Dates contacted: 5.13.25 and 5.19.25  Last scheduled delivery: 4.22.2025    The patient may be at risk of non-compliance with this medication. The patient should call the [facility] at [phone]  Option 4, then Option 2: Dermatology, Gastroenterology, Rheumatology to refill medication.    Marcella Serge   [facility] and [facility]

## 2023-10-11 NOTE — Unmapped (Signed)
 [facility] Delivery Pharm[facility] Refill Coordination Note    Specialty Medication(s) to be Shipped:   Inflammatory Disorders: Cimzia     Other medication(s) to be shipped: No additional medications requested for fill at this time     Alicia Norris, DOB: 2000/09/20  Phone: [phone] (home)       All above HIPAA information was verified with patient.     Was a Nurse, learning disability used for this call? No    Completed refill call assessment today to schedule patient's medication shipment from the [facility]  [phone]).  All relevant notes have been reviewed.     Specialty medication(s) and dose(s) confirmed: Regimen is correct and unchanged.   Changes to medications: Micha reports no changes at this time.  Changes to insurance: No  New side effects reported not previously addressed with a pharmacist or physician: None reported  Questions for the pharmacist: No    Confirmed patient received a Conservation officer, historic buildings and a Surveyor, mining with first shipment. The patient will receive a drug information handout for each medication shipped and additional FDA Medication Guides as required.       DISEASE/MEDICATION-SPECIFIC INFORMATION        For patients on injectable medications: Patient currently has 0 doses left.  Next injection is scheduled for 10/04/23.    SPECIALTY MEDICATION ADHERENCE     Medication Adherence    Patient reported X missed doses in the last month: 1  Specialty Medication: certolizumab pegol : CIMZIA  400 mg/2 mL (200 mg/mL x 2) Sykt injection syringe  Patient is on additional specialty medications: No  Patient is on more than two specialty medications: No              Were doses missed due to medication being on hold? No    certolizumab pegol : CIMZIA  400 mg/2 mL (200 mg/mL x 2) Sykt injection syringe  : 0 days of medicine on hand       REFERRAL TO PHARMACIST     Referral to the pharmacist: Yes - routine compliance concerns. Patient has missed 1-3 doses of medication. Refills were scheduled and concern routed to pharmacist for evaluation.      SHIPPING     Shipping address confirmed in Epic.     Cost and Payment: Patient has a copay of $4. They are aware and have authorized the pharmacy to charge the credit card on file.    Delivery Scheduled: Yes, Expected medication delivery date: 10/16/23.     Medication will be delivered via UPS to the prescription address in Epic WAM.    Alicia Norris   [facility]  Specialty Technician

## 2023-10-11 NOTE — Unmapped (Signed)
 This pharmacist was notified by a technician that this patient has reported that they've missed 1 doses of their Cimzia .. I have reviewed the patient's medical record and have determined that no further pharmacist action is needed.      Approximate time spent: 0-5 minutes    Court Distance, PharmD, Clinical Specialty Pharmacist  [facility]

## 2023-10-15 MED FILL — CIMZIA 400 MG/2 ML (200 MG/ML X 2) SUBCUTANEOUS SYRINGE KIT: SUBCUTANEOUS | 28 days supply | Qty: 2 | Fill #8

## 2023-11-05 DIAGNOSIS — M059 Rheumatoid arthritis with rheumatoid factor, unspecified: Principal | ICD-10-CM

## 2023-11-05 MED ORDER — CIMZIA 400 MG/2 ML (200 MG/ML X 2) SUBCUTANEOUS SYRINGE KIT
SUBCUTANEOUS | 3 refills | 84.00000 days | Status: CP
Start: 2023-11-05 — End: ?

## 2023-11-05 NOTE — Unmapped (Signed)
 Cimzia  refill  Last Visit Date: 08/09/2023  Next Visit Date: 11/09/2023    Lab Results   Component Value Date    ALT 47 08/09/2023    AST 30 08/09/2023    ALBUMIN 3.7 08/09/2023    CREATININE 0.63 08/09/2023     Lab Results   Component Value Date    WBC 9.1 08/09/2023    HGB 10.7 (L) 08/09/2023    HCT 32.4 (L) 08/09/2023    PLT 449 08/09/2023     Lab Results   Component Value Date    NEUTROPCT 67.4 08/09/2023    LYMPHOPCT 25.1 08/09/2023    MONOPCT 5.1 08/09/2023    EOSPCT 1.8 08/09/2023    BASOPCT 0.6 08/09/2023

## 2023-11-06 DIAGNOSIS — M059 Rheumatoid arthritis with rheumatoid factor, unspecified: Principal | ICD-10-CM

## 2023-11-06 NOTE — Unmapped (Signed)
 Rheumatology Clinic Follow-up Note     Assessment/Plan:    Alicia Norris is a 23 y.o.  female with a PMH of seropositive RA who presents today for follow up of RA.    Initially started on MTX with improvement but remained active despite 20mg  PO. Ultimately started on Enbrel  09/2021, she self-d/c'd MTX. Did well with Enbrel  but due to pregnancy (~09/2022), switched to Cimzia  monotherapy.    Today reporting worsening joint pain but stopped Cimzia  after last visit due to miscommunication.     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   - Continue HCQ 200mg  BID  ---- Need for annual eye exams  - Can consider starting MTX at next visit if still active since she is no longer breast feeding.  - Pred 10mg  daily for now. Two weeks after restarting Cimzia  try to decrease to pred 5mg  daily.   - CBC w/ diff, Cr, AST, ALT    F/U as scheduled in October with Dr. Stacia    I personally spent 30 minutes face-to-face and non-face-to-face in the care of this patient, which includes all pre, intra, and post visit time on the date of service.  All documented time was specific to the E/M visit and does not include any procedures that may have been performed.        Primary Care Provider: [facility]    HPI:  Alicia Norris is a 23 y.o.  female with a PMH of seropositive RA who presents today for follow up of RA.    Last Visit: 08/09/23 with Dr. Stacia. HCQ and pred 5mg  PO daily added due to continued disease activity on Cimzia . After pregnancy in breastfeeding can consider adding MTX.     Today  History of Present Illness  She has experienced increased joint pain and swelling since discontinuing Cimzia  approximately three months ago. The pain is severe, particularly in her feet and wrists, making it difficult to walk, bend her toes, cook, and perform daily tasks. She rates the pain as seven out of ten, despite taking prednisone  10 mg daily.    She has been taking hydroxychloroquine  (Plaquenil ) at a dose of two tablets per day and tolerates it well. However, she mistakenly stopped Cimzia , which was intended to be continued alongside hydroxychloroquine . The cessation of Cimzia  coincides with the worsening of her symptoms.    She reports swelling in her feet and hands, with the swelling in her hands making them appear wider. Stiffness occurs primarily during the day when engaging in activities, rather than in the morning. No recent fever or infections.    She recently stopped breastfeeding about a month ago. She has four children, aged six, five, four, and two, which keeps her busy and may contribute to the physical demands exacerbating her symptoms.    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 Re-presented 01/2021 and was [redacted]wks pregnant, was given pred monotherapy again. Started SSZ 02/2021, took for a few months but presented to [facility] ED on 05/2021 with flare, first saw [facility] 06/10/21 and started on MTX at that time. On MTX 20mg  PO she had significant improvement but remained active, Enbrel  added ~09/2021.    Review of Systems:  Positive findings noted above, otherwise a 14 point review of systems was reviewed and negative    Past Medical, Surgical, Family and Social  History reviewed and updated per EMR     Allergies:  Patient has no active allergies.    Medications:     Current Outpatient Medications:     acetaminophen  (TYLENOL ) 500 MG tablet, Take 2 tablets (1,000 mg total) by mouth every eight (8) hours., Disp: 30 tablet, Rfl: 0    empty container (SHARPS-A-GATOR DISPOSAL SYSTEM) Misc, Use as directed, Disp: 1 each, Rfl: 2    ferrous sulfate  325 (65 FE) MG tablet, Take 1 tablet (325 mg total) by mouth., Disp: , Rfl:     hydroxychloroquine  (PLAQUENIL ) 200 mg tablet, Take 2 tablets (400 mg total) by mouth daily., Disp: 180 tablet, Rfl: 3    ibuprofen (MOTRIN) 800 MG tablet, , Disp: , Rfl:     predniSONE  (DELTASONE ) 5 MG tablet, Take 1 tablet (5 mg total) by mouth daily., Disp: 90 tablet, Rfl: 0    certolizumab pegol  (CIMZIA ) 400 mg/2 mL (200 mg/mL x 2) SyKt injection syringe, Inject the contents of 1 syringe (200 mg total) under the skin every fourteen (14) days. (Patient not taking: Reported on 11/09/2023), Disp: 6 mL, Rfl: 3    certolizumab pegol  (CIMZIA ) 400 mg/2 mL (200 mg/mL x 2) SyKt injection syringe, Inject 2 mL (400 mg total) under the skin every fourteen (14) days for 3 doses. 400 mg [place] every two weeks x 3 doses. Load., Disp: 6 mL, Rfl: 0    copper 380 square MM (PARAGARD T 380A) 380 square mm IUD intrauterine device, 1 each by Intrauterine route once. (Patient not taking: Reported on 11/09/2023), Disp: , Rfl:     cyclobenzaprine  (FLEXERIL ) 5 MG tablet, Take 1 tablet (5 mg total) by mouth Three (3) times a day as needed for muscle spasms. (Patient not taking: Reported on 11/09/2023), Disp: 60 tablet, Rfl: 0    norethindrone (MICRONOR) 0.35 mg tablet, Take 1 tablet (0.35 mg total) by mouth daily. (Patient not taking: Reported on 11/09/2023), Disp: , Rfl:     prenatal vit no.126-iron-folic 28 mg iron- 800 mcg Tab, Take 1 tablet by mouth daily. (Patient not taking: Reported on 11/09/2023), Disp: , Rfl:       Objective   Vitals:    11/09/23 1329   BP: 108/81   BP Site: L Arm   BP Position: Sitting   BP Cuff Size: Medium   Pulse: 109   Temp: 36.4 ??C (97.5 ??F)   TempSrc: Temporal   Weight: 92.1 kg (203 lb)       Physical Exam  General: well appearing, no acute distress, pleasant  HEENT: EOMI, normal conjunctivae, MMM  Cardiovascular: Regular rate and rhythm. No murmurs, rubs or gallops.   Pulmonary: Clear to auscultation bilaterally. Normal work of breathing.  Skin: No rash, lesions, breakdown. No purpura or petechiae. No digital ulcers.   Extremities: Warm and well perfused, no cyanosis, clubbing or edema  Musculoskeletal: Tenderness and swelling to b/l thumb, 3rd MCP and R 2nd PIP. Tenderness to b/l wrists. Dec ROM to R shoulder due to pain. Good ROM of b/l knees. Tenderness and swelling to b/l ankles. Painful MTP squeeze b/l.  Neurologic: Cranial nerves grossly intact, strength 5/5 throughout.  Normal sensation  Psychiatric: Normal mood and affect.    Swollen Joint Count (0-28): 5  Tender Joint Count (0-28): 8  Patient Global Assessment of Disease Activity (0-10): 7  Evaluator Global Assessment of Disease (0-10): 7  CDAI Score: 27  CDAI interpretation:  0.0-2.8 Remission   2.9-10.0 Low disease activity   10.1-22.0 Moderate  disease activity   22.1-76.0 High disease activity

## 2023-11-09 ENCOUNTER — Encounter: Admit: 2023-11-09 | Discharge: 2023-11-10 | Payer: Medicaid (Managed Care) | Attending: Family | Primary: Family

## 2023-11-09 ENCOUNTER — Ambulatory Visit: Admit: 2023-11-09 | Discharge: 2023-11-10 | Payer: Medicaid (Managed Care) | Attending: Family | Primary: Family

## 2023-11-09 DIAGNOSIS — Z79899 Other long term (current) drug therapy: Principal | ICD-10-CM

## 2023-11-09 DIAGNOSIS — M059 Rheumatoid arthritis with rheumatoid factor, unspecified: Principal | ICD-10-CM

## 2023-11-09 LAB — CBC W/ AUTO DIFF
BASOPHILS ABSOLUTE COUNT: 0.1 10*9/L (ref 0.0–0.1)
BASOPHILS RELATIVE PERCENT: 0.9 %
EOSINOPHILS ABSOLUTE COUNT: 0.2 10*9/L (ref 0.0–0.5)
EOSINOPHILS RELATIVE PERCENT: 2.5 %
HEMATOCRIT: 35.8 % (ref 34.0–44.0)
HEMOGLOBIN: 11.7 g/dL (ref 11.3–14.9)
LYMPHOCYTES ABSOLUTE COUNT: 2.5 10*9/L (ref 1.1–3.6)
LYMPHOCYTES RELATIVE PERCENT: 26.6 %
MEAN CORPUSCULAR HEMOGLOBIN CONC: 32.6 g/dL (ref 32.0–36.0)
MEAN CORPUSCULAR HEMOGLOBIN: 23.7 pg — ABNORMAL LOW (ref 25.9–32.4)
MEAN CORPUSCULAR VOLUME: 72.6 fL — ABNORMAL LOW (ref 77.6–95.7)
MEAN PLATELET VOLUME: 7.5 fL (ref 6.8–10.7)
MONOCYTES ABSOLUTE COUNT: 0.3 10*9/L (ref 0.3–0.8)
MONOCYTES RELATIVE PERCENT: 3.5 %
NEUTROPHILS ABSOLUTE COUNT: 6.3 10*9/L (ref 1.8–7.8)
NEUTROPHILS RELATIVE PERCENT: 66.5 %
PLATELET COUNT: 410 10*9/L (ref 150–450)
RED BLOOD CELL COUNT: 4.93 10*12/L (ref 3.95–5.13)
RED CELL DISTRIBUTION WIDTH: 18.9 % — ABNORMAL HIGH (ref 12.2–15.2)
WBC ADJUSTED: 9.5 10*9/L (ref 3.6–11.2)

## 2023-11-09 LAB — SEDIMENTATION RATE: ERYTHROCYTE SEDIMENTATION RATE: 35 mm/h — ABNORMAL HIGH (ref 0–20)

## 2023-11-09 LAB — C-REACTIVE PROTEIN: C-REACTIVE PROTEIN: <5 mg/L (ref <=10.0)

## 2023-11-09 LAB — AST: AST (SGOT): 36 U/L — ABNORMAL HIGH (ref <=34)

## 2023-11-09 LAB — ALT: ALT (SGPT): 54 U/L — ABNORMAL HIGH (ref 10–49)

## 2023-11-09 MED ORDER — CERTOLIZUMAB PEGOL 400 MG/2 ML (200 MG/ML X2) SUBCUTANEOUS SYRINGE KIT
SUBCUTANEOUS | 0 refills | 42.00000 days | Status: CP
Start: 2023-11-09 — End: 2023-12-08
  Filled 2023-11-19: qty 4, 28d supply, fill #0

## 2023-11-12 DIAGNOSIS — M059 Rheumatoid arthritis with rheumatoid factor, unspecified: Principal | ICD-10-CM

## 2023-11-13 DIAGNOSIS — Z79899 Other long term (current) drug therapy: Principal | ICD-10-CM

## 2023-11-13 DIAGNOSIS — M059 Rheumatoid arthritis with rheumatoid factor, unspecified: Principal | ICD-10-CM

## 2023-11-13 NOTE — Unmapped (Signed)
 [facility] Specialty and Home Delivery Pharmacy Refill Coordination Note    Specialty Medication(s) to be Shipped:   Inflammatory Disorders: Cimzia     Other medication(s) to be shipped: No additional medications requested for fill at this time     Alicia Norris, DOB: October 25, 2000  Phone: [phone] (home)       All above HIPAA information was verified with patient.     Was a Nurse, learning disability used for this call? No    Completed refill call assessment today to schedule patient's medication shipment from the [facility] and Home Delivery Pharmacy  [phone]).  All relevant notes have been reviewed.     Specialty medication(s) and dose(s) confirmed: Regimen is correct and unchanged.   Changes to medications: Alicia Norris reports no changes at this time.  Changes to insurance: No  New side effects reported not previously addressed with a pharmacist or physician: None reported  Questions for the pharmacist: No    Confirmed patient received a Conservation officer, historic buildings and a Surveyor, mining with first shipment. The patient will receive a drug information handout for each medication shipped and additional FDA Medication Guides as required.       DISEASE/MEDICATION-SPECIFIC INFORMATION        For patients on injectable medications: Patient currently has 0 doses left.  Next injection is scheduled for  11/22/23.    SPECIALTY MEDICATION ADHERENCE     Medication Adherence    Patient reported X missed doses in the last month: 0  Specialty Medication: CIMZIA  400 mg/2 mL (200 mg/mL x 2) Sykt injection syringe (certolizumab pegol )  Patient is on additional specialty medications: No  Patient is on more than two specialty medications: No  Any gaps in refill history greater than 2 weeks in the last 3 months: no  Demonstrates understanding of importance of adherence: yes              Were doses missed due to medication being on hold? No    certolizumab pegol : CIMZIA  400 mg/2 mL (200 mg/mL x 2) Sykt injection syringe  : 0 days of medicine on hand REFERRAL TO PHARMACIST     Referral to the pharmacist: Not needed      [facility]     Shipping address confirmed in Epic.     Cost and Payment: Patient has a copay of $4. They are aware and have authorized the pharmacy to charge the credit card on file.    Delivery Scheduled: Yes, Expected medication delivery date: 11/20/23.     Medication will be delivered via UPS to the prescription address in Epic [place].    Alicia Norris   [facility] Specialty and Home Delivery Pharmacy  Specialty Technician

## 2023-12-17 NOTE — Unmapped (Signed)
 [facility] has been contacted in regards to their refill of certolizumab pegol : CIMZIA  400 mg/2 mL (200 mg/mL x 2) Sykt injection syringe. At this time, they have declined refill due to patient having 3 doses remaining. Refill assessment call date has been updated per the patient's request.

## 2024-01-17 DIAGNOSIS — M059 Rheumatoid arthritis with rheumatoid factor, unspecified: Principal | ICD-10-CM

## 2024-01-17 MED ORDER — CIMZIA 400 MG/2 ML (200 MG/ML X 2) SUBCUTANEOUS SYRINGE KIT
SUBCUTANEOUS | 0 refills | 42.00000 days
Start: 2024-01-17 — End: 2024-02-15

## 2024-01-17 NOTE — Unmapped (Signed)
 [facility] Special[facility] and Home Delivery Pharmacy Refill Coordination Note    Specialty Medication(s) to be Shipped:   Inflammatory Disorders: Alicia Norris     Other medication(s) to be shipped: No additional medications requested for fill at this time    Specialty Medications not needed at this time: N/A     Alicia Norris, DOB: 18-Jul-2000  Phone: [phone] (home)       All above HIPAA information was verified with patient.     Was a Nurse, learning disability used for this call? No    Completed refill call assessment today to schedule patient's medication shipment from the [facility] and Home Delivery Pharmacy  [phone]).  All relevant notes have been reviewed.     Specialty medication(s) and dose(s) confirmed: Regimen is correct and unchanged.   Changes to medications: Alicia Norris reports no changes at this time.  Changes to insurance: No  New side effects reported not previously addressed with a pharmacist or physician: None reported  Questions for the pharmacist: No    Confirmed patient received a Conservation officer, historic buildings and a Surveyor, mining with first shipment. The patient will receive a drug information handout for each medication shipped and additional FDA Medication Guides as required.       DISEASE/MEDICATION-SPECIFIC INFORMATION        For patients on injectable medications: Next injection is scheduled for 01/31/24.    SPECIALTY MEDICATION ADHERENCE     Medication Adherence    Patient reported X missed doses in the last month: 0  Specialty Medication: Alicia Norris  400 mg/2 mL  Patient is on additional specialty medications: No  Informant: patient     Were doses missed due to medication being on hold? No    Alicia Norris  400mg /56mL: 0 doses of medicine on hand       REFERRAL TO PHARMACIST     Referral to the pharmacist: Not needed      [facility]     Shipping address confirmed in Epic.     Cost and Payment: Patient has a copay of $4. They are aware and have authorized the pharmacy to charge the credit card on file.    Delivery Scheduled: Yes, Expected medication delivery date: 01/29/24.  However, Rx request for refills was sent to the provider as there are none remaining.     Medication will be delivered via UPS to the prescription address in Epic [place].    Alicia Norris   [facility]  Specialty Technician

## 2024-01-28 NOTE — Unmapped (Signed)
 Alicia Norris 's Cimzia  shipment will be delayed as a result of credit card ending in 7047 declined     I have reached out to the patient  at [phone]) [phone] and left a voicemail message.  We will wait for a call back from the patient to reschedule the delivery.  We have not confirmed the new delivery date.

## 2024-01-30 NOTE — Unmapped (Signed)
 [facility] [place] 's CIMZIA  400 mg/2 mL (200 mg/mL x 2) Sykt injection syringe (certolizumab pegol ) shipment will be canceled as a result of credit card ending in 7047 declined     I have reached out to the patient  at [phone] and left a voicemail message.  We will not reschedule the medication and have removed this/these medication(s) from the work request.  We have canceled this work request.

## 2024-02-07 MED FILL — CIMZIA 400 MG/2 ML (200 MG/ML X 2) SUBCUTANEOUS SYRINGE KIT: SUBCUTANEOUS | 28 days supply | Qty: 2 | Fill #0

## 2024-02-07 NOTE — Unmapped (Signed)
 Alicia Norris 's Cimzia  shipment will be rescheduled as a result of credit card on file and copay collected.     I have reached out to the patient  via incoming phone call and communicated the delivery change. We will reschedule the medication for the delivery date that the patient agreed upon.  We have confirmed the delivery date as 9/26, via ups.

## 2024-02-14 ENCOUNTER — Ambulatory Visit
Admit: 2024-02-14 | Payer: Medicaid (Managed Care) | Attending: Student in an Organized Health Care Education/Training Program | Primary: Student in an Organized Health Care Education/Training Program

## 2024-03-06 NOTE — Unmapped (Signed)
 [facility] Specialty and Home Delivery Pharmacy Refill Coordination Note    Specialty Medication(s) to be Shipped:   Inflammatory Disorders: Cimzia     Other medication(s) to be shipped: No additional medications requested for fill at this time    Specialty Medications not needed at this time: N/A     Alicia Norris, DOB: 2000/12/26  Phone: [phone] (home)       All above HIPAA information was verified with patient.     Was a Nurse, learning disability used for this call? No    Completed refill call assessment today to schedule patient's medication shipment from the [facility] and Home Delivery Pharmacy  [phone]).  All relevant notes have been reviewed.     Specialty medication(s) and dose(s) confirmed: Regimen is correct and unchanged.   Changes to medications: Bayla reports no changes at this time.  Changes to insurance: No  New side effects reported not previously addressed with a pharmacist or physician: None reported  Questions for the pharmacist: No    Confirmed patient received a Conservation officer, historic buildings and a Surveyor, mining with first shipment. The patient will receive a drug information handout for each medication shipped and additional FDA Medication Guides as required.       DISEASE/MEDICATION-SPECIFIC INFORMATION        For patients on injectable medications: Next injection is scheduled for 10.23.25.    SPECIALTY MEDICATION ADHERENCE     Medication Adherence    Patient reported X missed doses in the last month: 0  Specialty Medication: certolizumab pegol : CIMZIA  400 mg/2 mL (200 mg/mL x 2) Sykt injection syringe  Patient is on additional specialty medications: No              Were doses missed due to medication being on hold? No       certolizumab pegol : CIMZIA  400 mg/2 mL (200 mg/mL x 2) Sykt injection syringe : 0 doses of medicine on hand       REFERRAL TO PHARMACIST     Referral to the pharmacist: Not needed      SHIPPING     Shipping address confirmed in Epic.     Cost and Payment: Patient has a copay of $4. They are aware and have authorized the pharmacy to charge the credit card on file.    Delivery Scheduled: Yes, Expected medication delivery date: 10.28.25.     Medication will be delivered via UPS to the prescription address in Epic [place].    Doyal Hurst   [facility] Special[facility] and Home Delivery Pharm[facility]  Specialty Technician

## 2024-03-10 NOTE — Progress Notes (Signed)
 Alicia Norris 's Cimzia  shipment will be delayed as a result of credit card ending in [phone] declined     I have reached out to the patient  at [phone] and left a voicemail message.  We will wait for a call back from the patient to reschedule the delivery.  We have not confirmed the new delivery date.

## 2024-03-11 MED FILL — CIMZIA 400 MG/2 ML (200 MG/ML X 2) SUBCUTANEOUS SYRINGE KIT: SUBCUTANEOUS | 28 days supply | Qty: 2 | Fill #1

## 2024-03-31 NOTE — Progress Notes (Signed)
 [facility] Specialty and Home Delivery Pharmacy Refill Coordination Note    Specialty Medication(s) to be Shipped:   Inflammatory Disorders: Cimzia     Other medication(s) to be shipped: No additional medications requested for fill at this time    Specialty Medications not needed at this time: N/A     Alicia Norris, DOB: 2000-09-28  Phone: [phone] (home)       All above HIPAA information was verified with patient.     Was a nurse, learning disability used for this call? No    Completed refill call assessment today to schedule patient's medication shipment from the [facility] and Home Delivery Pharmacy  [phone]).  All relevant notes have been reviewed.     Specialty medication(s) and dose(s) confirmed: Regimen is correct and unchanged.   Changes to medications: Paulina reports no changes at this time.  Changes to insurance: No  New side effects reported not previously addressed with a pharmacist or physician: None reported  Questions for the pharmacist: No    Confirmed patient received a Conservation Officer, Historic Buildings and a Surveyor, Mining with first shipment. The patient will receive a drug information handout for each medication shipped and additional FDA Medication Guides as required.       DISEASE/MEDICATION-SPECIFIC INFORMATION        For patients on injectable medications: Next injection is scheduled for 11.27.25.    SPECIALTY MEDICATION ADHERENCE     Medication Adherence    Patient reported X missed doses in the last month: 0  Specialty Medication: CIMZIA  400 mg/2 mL (200 mg/mL x 2) Sykt injection syringe (certolizumab pegol )  Patient is on additional specialty medications: No              Were doses missed due to medication being on hold? No      CIMZIA  400 mg/2 mL (200 mg/mL x 2) Sykt injection syringe (certolizumab pegol ): 0 doses of medicine on hand       REFERRAL TO PHARMACIST     Referral to the pharmacist: Not needed      [facility]     Shipping address confirmed in Epic.     Cost and Payment: Patient has a copay of $4. They are aware and have authorized the pharmacy to charge the credit card on file.    Delivery Scheduled: Yes, Expected medication delivery date: 11.20.25.     Medication will be delivered via UPS to the prescription address in Epic [place].    Doyal Hurst   [facility] Specialty and Home Delivery Pharmacy  Specialty Technician

## 2024-04-02 MED FILL — CIMZIA 400 MG/2 ML (200 MG/ML X 2) SUBCUTANEOUS SYRINGE KIT: SUBCUTANEOUS | 28 days supply | Qty: 2 | Fill #2

## 2024-04-24 NOTE — Progress Notes (Signed)
 [facility] Specialty and Home Delivery Pharmacy Refill Coordination Note    Specialty Medication(s) to be Shipped:   Inflammatory Disorders: Cimzia     Other medication(s) to be shipped: No additional medications requested for fill at this time    Specialty Medications not needed at this time: N/A     Alicia Norris, DOB: [birth year]  Phone: [phone] (home)       All above HIPAA information was verified with patient.     Was a nurse, learning disability used for this call? No    Completed refill call assessment today to schedule patient's medication shipment from the [facility] and Home Delivery Pharmacy  [phone]).  All relevant notes have been reviewed.     Specialty medication(s) and dose(s) confirmed: Regimen is correct and unchanged.   Changes to medications: Alicia Norris reports no changes at this time.  Changes to insurance: No  New side effects reported not previously addressed with a pharmacist or physician: None reported  Questions for the pharmacist: No    Confirmed patient received a Conservation Officer, Historic Buildings and a Surveyor, Mining with first shipment. The patient will receive a drug information handout for each medication shipped and additional FDA Medication Guides as required.       DISEASE/MEDICATION-SPECIFIC INFORMATION        For patients on injectable medications: Next injection is scheduled for 12.18.25.    SPECIALTY MEDICATION ADHERENCE     Medication Adherence    Patient reported X missed doses in the last month: 0  Specialty Medication: CIMZIA  400 mg/2 mL (200 mg/mL x 2) Sykt injection syringe (certolizumab pegol )  Patient is on additional specialty medications: No              Were doses missed due to medication being on hold? No        CIMZIA  400 mg/2 mL (200 mg/mL x 2) Sykt injection syringe (certolizumab pegol ): 0 doses of medicine on hand     REFERRAL TO PHARMACIST     Referral to the pharmacist: Not needed      [facility]     Shipping address confirmed in Epic.     Cost and Payment: Patient has a copay of $4. They are aware and have authorized the pharmacy to charge the credit card on file.    Delivery Scheduled: Yes, Expected medication delivery date: 12.16.25.     Medication will be delivered via UPS to the prescription address in Epic [place].    Alicia Norris   [facility] Specialty and Home Delivery Pharmacy  Specialty Technician

## 2024-04-28 NOTE — Progress Notes (Signed)
 [facility] 's CIMZIA  400 mg/2 mL (200 mg/mL x 2) Sykt injection syringe (certolizumab pegol ) shipment will be delayed as a result of credit card ending in [id number] declined     I have reached out to the patient  at [phone]) [phone] and communicated the delay. We will wait for a call back from the patient to reschedule the delivery.  We have not confirmed the new delivery date.

## 2024-05-01 NOTE — Progress Notes (Signed)
 [facility] 's CIMZIA  400 mg/2 mL (200 mg/mL x 2) Sykt injection syringe (certolizumab pegol ) shipment will be canceled as a result of credit card ending in [id number] declined     I have reached out to the patient  at [phone]) [phone] and left a voicemail message.  We will not reschedule the medication and have removed this/these medication(s) from the work request.  We have canceled this work request.

## 2024-05-26 NOTE — Progress Notes (Signed)
 [facility] Specialty and Home Delivery Pharmacy Refill Coordination Note    Specialty Medication(s) to be Shipped:   Inflammatory Disorders: Cimzia     Other medication(s) to be shipped: No additional medications requested for fill at this time    Specialty Medications not needed at this time: N/A     Alicia Norris, DOB: 2000-11-13  Phone: [phone] (home)       All above HIPAA information was verified with patient.     Was a nurse, learning disability used for this call? No    Completed refill call assessment today to schedule patient's medication shipment from the [facility] and Home Delivery Pharmacy  [phone]).  All relevant notes have been reviewed.     Specialty medication(s) and dose(s) confirmed: Regimen is correct and unchanged.   Changes to medications: Marcele reports no changes at this time.  Changes to insurance: No  New side effects reported not previously addressed with a pharmacist or physician: None reported  Questions for the pharmacist: No    Confirmed patient received a Conservation Officer, Historic Buildings and a Surveyor, Mining with first shipment. The patient will receive a drug information handout for each medication shipped and additional FDA Medication Guides as required.       DISEASE/MEDICATION-SPECIFIC INFORMATION        N/A    SPECIALTY MEDICATION ADHERENCE     Medication Adherence    Patient reported X missed doses in the last month: 0  Specialty Medication: certolizumab pegol : CIMZIA  400 mg/2 mL (200 mg/mL x 2) Sykt injection syringe  Patient is on additional specialty medications: No              Were doses missed due to medication being on hold? No      certolizumab pegol : CIMZIA  400 mg/2 mL (200 mg/mL x 2) Sykt injection syringe: 0 doses of medicine on hand       Specialty medication is an injection or given on a cycle: Yes, Next injection is scheduled for 1.22.26.    REFERRAL TO PHARMACIST     Referral to the pharmacist: Not needed      SHIPPING     Shipping address confirmed in Epic.     Cost and Payment: Patient has a copay of $4. They are aware and have authorized the pharmacy to charge the credit card on file.    Delivery Scheduled: Yes, Expected medication delivery date: 1.16.26.     Medication will be delivered via UPS to the prescription address in Epic [place].    Doyal Hurst   [facility] Specialty and Home Delivery Pharmacy  Specialty Technician

## 2024-05-29 MED FILL — CIMZIA 400 MG/2 ML (200 MG/ML X 2) SUBCUTANEOUS SYRINGE KIT: SUBCUTANEOUS | 28 days supply | Qty: 2 | Fill #3
